# Patient Record
Sex: Male | Born: 1985 | Race: Black or African American | Hispanic: No | Marital: Single | Smoking: Former smoker
Health system: Southern US, Community
[De-identification: ages and names within clinical notes are randomized; demographics above are authoritative.]

## PROBLEM LIST (undated history)

## (undated) HISTORY — PX: MEDIAL COLLATERAL LIGAMENT AND LATERAL COLLATERAL LIGAMENT REPAIR, KNEE: SHX2017

## (undated) HISTORY — PX: ARTHROSCOPIC REPAIR ACL: SUR80

---

## 2004-03-10 ENCOUNTER — Emergency Department (HOSPITAL_COMMUNITY): Admission: EM | Admit: 2004-03-10 | Discharge: 2004-03-10 | Payer: Self-pay | Admitting: *Deleted

## 2006-12-10 ENCOUNTER — Emergency Department (HOSPITAL_COMMUNITY): Admission: EM | Admit: 2006-12-10 | Discharge: 2006-12-10 | Payer: Self-pay | Admitting: Emergency Medicine

## 2007-07-04 ENCOUNTER — Emergency Department (HOSPITAL_COMMUNITY): Admission: EM | Admit: 2007-07-04 | Discharge: 2007-07-04 | Payer: Self-pay | Admitting: Emergency Medicine

## 2007-07-30 ENCOUNTER — Ambulatory Visit (HOSPITAL_BASED_OUTPATIENT_CLINIC_OR_DEPARTMENT_OTHER): Admission: RE | Admit: 2007-07-30 | Discharge: 2007-07-31 | Payer: Self-pay | Admitting: Orthopaedic Surgery

## 2009-02-17 ENCOUNTER — Emergency Department (HOSPITAL_COMMUNITY): Admission: EM | Admit: 2009-02-17 | Discharge: 2009-02-17 | Payer: Self-pay | Admitting: Emergency Medicine

## 2009-09-25 ENCOUNTER — Emergency Department (HOSPITAL_COMMUNITY): Admission: EM | Admit: 2009-09-25 | Discharge: 2009-09-25 | Payer: Self-pay | Admitting: Emergency Medicine

## 2009-09-28 ENCOUNTER — Emergency Department (HOSPITAL_COMMUNITY): Admission: EM | Admit: 2009-09-28 | Discharge: 2009-09-28 | Payer: Self-pay | Admitting: Emergency Medicine

## 2010-07-26 NOTE — Op Note (Signed)
[name], [name]            ACCOUNT NO.:  [id number]   MEDICAL RECORD NO.:  [id number]          PATIENT TYPE:  AMB   LOCATION:  DSC                          FACILITY:  [facility]   PHYSICIAN:  Lubertha Basque. Dalldorf, M.D.DATE OF BIRTH:  05-Oct-1985   DATE OF PROCEDURE:  07/30/2007  DATE OF DISCHARGE:                               OPERATIVE REPORT   PREOPERATIVE DIAGNOSES:  1. Right knee anterior cruciate ligament tear.  2. Right knee torn medial meniscus.   POSTOPERATIVE DIAGNOSES:  1. Right knee anterior cruciate ligament tear.  2. Right knee torn lateral meniscus.   PROCEDURES:  1. Right knee anterior cruciate ligament reconstruction.  2. Right knee partial lateral meniscectomy.   ANESTHESIA:  General.   ATTENDING SURGEON:  Lubertha Basque. Jerl Santos, MD   ASSISTANT:  Lindwood Qua, PA   INDICATIONS FOR PROCEDURE:  The patient is a 25 year old male who is  about a year from a significant knee injury.  He underwent evaluation  done with an MRI scan, which showed an ACL tear.  He has continued to  play basketball, but has suffered recurrent giving way episodes and  effusions.  Seen recently, he is offered ACL reconstruction in hopes of  stabilizing his knee and allowing athletic endeavors.  Informed  operative consent was obtained after discussion of possible  complications of reaction to anesthesia, infection, and DVT.  The  importance of the postoperative rehabilitation program to optimize  result was also stressed with the patient.   SUMMARY, FINDINGS, AND PROCEDURE:  Under general anesthesia, an  arthroscopy of the right knee was performed.  Suprapatellar pouch was  benign while the patellofemoral joint exhibited some focal degeneration  of the apex of patella addressed with a brief chondroplasty for grade 3  change.  The medial compartment exhibited no evidence of meniscal or  articular cartilage injury.  The ACL was completely torn while the PCL  underneath was intact.  The  lateral compartment exhibited displaced full  tear of the posterior horn of the lateral meniscus, which appeared to be  eroding an area near the notch in the lateral compartment with some  grade 3 change, both femoral and tibial.  Chondroplasties were done and  loose body removal was done to remove the articular cartilage resulting  from this abrasion.  We also performed the partial lateral meniscectomy  taking about 15% of this structure back to a stable rim.  I then  reconstructed the ACL with middle third patellar tendon autograft,  stabilized at both ends with 8 x 25 metal Linvatec screws.  Lindwood Qua assisted throughout and was invaluable to the completion of the  case in that he helped position and retract while I performed the  procedure.  He also fashioned the autograft on the back table while I  performed arthroscopic portions of the case, thereby significantly  minimizing OR time.   DESCRIPTION OF PROCEDURE:  The patient was brought to the operating  suite where general anesthetic was applied without difficulty.  He was  positioned supine and prepped and draped in normal sterile fashion.  After administration of IV Kefzol, an  arthroscopy of the right knee was  performed through a total of 2 portals.  Findings were as noted above.  Procedure consisted of loose body removal and chondroplasty in the  lateral compartment followed by partial lateral meniscectomy done with  basket and shaver, taking about 15% of the structure back to a stable  rim.  The ACL stump was removed followed by a conservative notchplasty  done with the burr.  The PCL underneath was intact and appeared normal.  The leg was then elevated, exsanguinated, and tourniquet was placed  about the thigh.  An anterior incision was made along the medial border  of the patellar tendon with dissection down through the peritenon to  expose the tendon.  The middle third of the structure was then harvested  with  oscillating saw and Metzenbaum scissors, harvesting contiguous bone  blocks from the tibial tubercle and inferior pole of the patella.  This  graft was then fashioned on the back table by Lindwood Qua, assisted  through 9 and 10 mm tunnels.  Drill holes were placed in the bone plugs  with a PDS suture placed in one and a wire placed in the other.  Then, a  guide was placed in the knee just anterior to the PCL and utilized to  pass a guidewire through the anterior wound up into the knee.  This was  then overreamed to a diameter of 11 mm.  The guide was placed through  this tunnel and the over-the-top position and utilized to pass the  guidewire through the tibial tunnel and through the femur and out the  proximal thigh.  Over this, we reamed the distal femur to a depth of 3  cm in diameter of 9 mm.  A 1 or 2 mm posterior wall was well-visualized.  Bony debris was removed from the shaver.  The aforementioned graft was  then pulled through the tibial tunnel into the femoral tunnel.  Care was  taken to keep the tendinous surface of the graft facing in a posterior  direction as the leading bone plug entered the femoral tunnel.  I placed  a guidewire in an anterior position of the femoral tunnel and over this  placed a 8 x 25 metal Linvatec screw in interference fashion.  We then  ranged the knee and the graft was felt to be isometric.  A second  guidewire was placed up through the tibial tunnel and seem to enter the  knee arthroscopically.  Over this, I placed an identical interference  screw and secured the trailing bone plug in the tibia.  Arthroscopic  equipment was removed.  Some bone from graft preparation was placed at  the patellar defect followed by closure of the peritenon with 0 Vicryl  in running fashion.  The tourniquet was deflated and a small amount of  bleeding was easily controlled with Bovie cautery.  We then  reapproximated the subcutaneous tissues with 3-0 undyed Vicryl  and skin  with nylon.  Marcaine was injected about the incision site and into the  knee.  Adaptic was applied followed by dry gauze and loose Ace wrap.  Estimated blood loss and fluids as well as accurate tourniquet time  could be obtained from anesthesia records.  At the end of the case, he  had a nice tight Lachman's test.   DISPOSITION:  The patient was extubated in the operating room and taken  to the recovery room in stable condition.  He was to be admitted for  overnight observation for pain control and probable discharged home in  the morning.      Lubertha Basque Jerl Santos, M.D.  Electronically Signed     PGD/MEDQ  D:  07/30/2007  T:  07/30/2007  Job:  [id number]

## 2010-12-07 LAB — POCT HEMOGLOBIN-HEMACUE: Hemoglobin: 16

## 2011-01-29 ENCOUNTER — Emergency Department (HOSPITAL_COMMUNITY)
Admission: EM | Admit: 2011-01-29 | Discharge: 2011-01-29 | Disposition: A | Discharge status: Home / Self Care | Payer: Self-pay | Attending: Emergency Medicine | Admitting: Emergency Medicine

## 2011-01-29 ENCOUNTER — Emergency Department (HOSPITAL_COMMUNITY): Payer: Self-pay

## 2011-01-29 ENCOUNTER — Encounter: Payer: Self-pay | Admitting: *Deleted

## 2011-01-29 DIAGNOSIS — R51 Headache: Secondary | ICD-10-CM | POA: Insufficient documentation

## 2011-01-29 DIAGNOSIS — R221 Localized swelling, mass and lump, neck: Secondary | ICD-10-CM | POA: Insufficient documentation

## 2011-01-29 DIAGNOSIS — S022XXA Fracture of nasal bones, initial encounter for closed fracture: Secondary | ICD-10-CM | POA: Insufficient documentation

## 2011-01-29 DIAGNOSIS — M25519 Pain in unspecified shoulder: Secondary | ICD-10-CM | POA: Insufficient documentation

## 2011-01-29 DIAGNOSIS — S0993XA Unspecified injury of face, initial encounter: Secondary | ICD-10-CM | POA: Insufficient documentation

## 2011-01-29 DIAGNOSIS — IMO0002 Reserved for concepts with insufficient information to code with codable children: Secondary | ICD-10-CM | POA: Insufficient documentation

## 2011-01-29 DIAGNOSIS — IMO0001 Reserved for inherently not codable concepts without codable children: Secondary | ICD-10-CM | POA: Insufficient documentation

## 2011-01-29 DIAGNOSIS — R040 Epistaxis: Secondary | ICD-10-CM | POA: Insufficient documentation

## 2011-01-29 DIAGNOSIS — R220 Localized swelling, mass and lump, head: Secondary | ICD-10-CM | POA: Insufficient documentation

## 2011-01-29 MED ORDER — IBUPROFEN 800 MG PO TABS: 800.0000 mg | ORAL_TABLET | Freq: Three times a day (TID) | ORAL | Status: AC

## 2011-01-29 MED ORDER — OXYMETAZOLINE HCL 0.05 % NA SOLN: 2.0000 | Freq: Two times a day (BID) | NASAL | Status: AC

## 2011-01-29 NOTE — ED Notes (Signed)
Pt c/o pain and abrasion above upper lip and lac rt side lower lip.
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
## 2011-01-30 ENCOUNTER — Encounter (HOSPITAL_COMMUNITY): Payer: Self-pay | Admitting: *Deleted

## 2011-01-30 ENCOUNTER — Emergency Department (HOSPITAL_COMMUNITY): Payer: Self-pay

## 2011-01-30 ENCOUNTER — Emergency Department (HOSPITAL_COMMUNITY)
Admission: EM | Admit: 2011-01-30 | Discharge: 2011-01-30 | Discharge status: Absent without leave | Payer: Self-pay | Source: Home / Self Care

## 2011-01-30 ENCOUNTER — Emergency Department (HOSPITAL_COMMUNITY)
Admission: EM | Admit: 2011-01-30 | Discharge: 2011-01-30 | Disposition: A | Discharge status: Home / Self Care | Payer: Self-pay | Attending: Emergency Medicine | Admitting: Emergency Medicine

## 2011-01-30 DIAGNOSIS — M79609 Pain in unspecified limb: Secondary | ICD-10-CM | POA: Insufficient documentation

## 2011-01-30 DIAGNOSIS — S60229A Contusion of unspecified hand, initial encounter: Secondary | ICD-10-CM | POA: Insufficient documentation

## 2011-01-30 DIAGNOSIS — M25539 Pain in unspecified wrist: Secondary | ICD-10-CM | POA: Insufficient documentation

## 2011-01-30 DIAGNOSIS — S60219A Contusion of unspecified wrist, initial encounter: Secondary | ICD-10-CM | POA: Insufficient documentation

## 2011-01-30 MED ORDER — IBUPROFEN 200 MG PO TABS: 400.0000 mg | ORAL_TABLET | Freq: Once | ORAL | Status: DC

## 2011-01-30 MED ORDER — OXYCODONE-ACETAMINOPHEN 5-325 MG PO TABS
1.0000 | ORAL_TABLET | Freq: Once | ORAL | Status: AC
  Administered 2011-01-30: 1 via ORAL
  Filled 2011-01-30: qty 1

## 2011-01-30 MED ORDER — IBUPROFEN 800 MG PO TABS
800.0000 mg | ORAL_TABLET | Freq: Once | ORAL | Status: AC
  Administered 2011-01-30: 800 mg via ORAL
  Filled 2011-01-30: qty 1

## 2011-01-30 MED ORDER — OXYCODONE-ACETAMINOPHEN 5-325 MG PO TABS: 1.0000 | ORAL_TABLET | Freq: Once | ORAL | Status: DC
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
## 2011-01-30 NOTE — ED Notes (Signed)
Patient transported to X-ray 
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
## 2011-02-23 NOTE — ED Provider Notes (Signed)
Evaluation and management procedures were performed by the mid-level provider (PA/NP/CNM) under my supervision/collaboration. I was present and available during the ED course. Gauge Winski Y.   Gavin Pound. Oletta Lamas, MD 02/23/11 2157

## 2013-07-16 ENCOUNTER — Emergency Department (HOSPITAL_COMMUNITY)
Admission: EM | Admit: 2013-07-16 | Discharge: 2013-07-16 | Disposition: A | Discharge status: Home / Self Care | Payer: No Typology Code available for payment source | Attending: Emergency Medicine | Admitting: Emergency Medicine

## 2013-07-16 ENCOUNTER — Emergency Department (HOSPITAL_COMMUNITY): Payer: No Typology Code available for payment source

## 2013-07-16 ENCOUNTER — Encounter (HOSPITAL_COMMUNITY): Payer: Self-pay | Admitting: Emergency Medicine

## 2013-07-16 DIAGNOSIS — F17200 Nicotine dependence, unspecified, uncomplicated: Secondary | ICD-10-CM | POA: Insufficient documentation

## 2013-07-16 DIAGNOSIS — S99929A Unspecified injury of unspecified foot, initial encounter: Secondary | ICD-10-CM

## 2013-07-16 DIAGNOSIS — Y9389 Activity, other specified: Secondary | ICD-10-CM | POA: Insufficient documentation

## 2013-07-16 DIAGNOSIS — M7918 Myalgia, other site: Secondary | ICD-10-CM

## 2013-07-16 DIAGNOSIS — S46909A Unspecified injury of unspecified muscle, fascia and tendon at shoulder and upper arm level, unspecified arm, initial encounter: Secondary | ICD-10-CM | POA: Insufficient documentation

## 2013-07-16 DIAGNOSIS — Z9889 Other specified postprocedural states: Secondary | ICD-10-CM | POA: Insufficient documentation

## 2013-07-16 DIAGNOSIS — S4980XA Other specified injuries of shoulder and upper arm, unspecified arm, initial encounter: Secondary | ICD-10-CM | POA: Insufficient documentation

## 2013-07-16 DIAGNOSIS — Y92410 Unspecified street and highway as the place of occurrence of the external cause: Secondary | ICD-10-CM | POA: Insufficient documentation

## 2013-07-16 DIAGNOSIS — S0990XA Unspecified injury of head, initial encounter: Secondary | ICD-10-CM | POA: Insufficient documentation

## 2013-07-16 DIAGNOSIS — S8990XA Unspecified injury of unspecified lower leg, initial encounter: Secondary | ICD-10-CM | POA: Insufficient documentation

## 2013-07-16 DIAGNOSIS — S0993XA Unspecified injury of face, initial encounter: Secondary | ICD-10-CM | POA: Insufficient documentation

## 2013-07-16 DIAGNOSIS — S199XXA Unspecified injury of neck, initial encounter: Principal | ICD-10-CM

## 2013-07-16 DIAGNOSIS — S99919A Unspecified injury of unspecified ankle, initial encounter: Secondary | ICD-10-CM

## 2013-07-16 MED ORDER — HYDROCODONE-ACETAMINOPHEN 5-325 MG PO TABS: ORAL_TABLET | ORAL | Status: DC
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
## 2013-07-16 NOTE — Discharge Instructions (Signed)
Take vicodin for breakthrough pain, do not drink alcohol, drive, care for children or do other critical tasks while taking vicodin.  Do not hesitate to return to the Emergency Department for any new, worsening or concerning symptoms.   If you do not have a primary care doctor you can establish one at the   [facility]: [address] [place] [id number] [phone]  After you establish care. Let them know you were seen in the emergency room. They must obtain records for further management.     Myalgia, Adult Myalgia is the medical term for muscle pain. It is a symptom of many things. Nearly everyone at some time in their life has this. The most common cause for muscle pain is overuse or straining and more so when you are not in shape. Injuries and muscle bruises cause myalgias. Muscle pain without a history of injury can also be caused by a virus. It frequently comes along with the flu. Myalgia not caused by muscle strain can be present in a large number of infectious diseases. Some autoimmune diseases like lupus and fibromyalgia can cause muscle pain. Myalgia may be mild, or severe. SYMPTOMS  The symptoms of myalgia are simply muscle pain. Most of the time this is short lived and the pain goes away without treatment. DIAGNOSIS  Myalgia is diagnosed by your caregiver by taking your history. This means you tell him when the problems began, what they are, and what has been happening. If this has not been a long term problem, your caregiver may want to watch for a while to see what will happen. If it has been long term, they may want to do additional testing. TREATMENT  The treatment depends on what the underlying cause of the muscle pain is. Often anti-inflammatory medications will help. HOME CARE INSTRUCTIONS  If the pain in your muscles came from overuse, slow down your activities until the problems go away.  Myalgia from overuse of a muscle can be treated with  alternating hot and cold packs on the muscle affected or with cold for the first couple days. If either heat or cold seems to make things worse, stop their use.  Apply ice to the sore area for 15-20 minutes, 03-04 times per day, while awake for the first 2 days of muscle soreness, or as directed. Put the ice in a plastic bag and place a towel between the bag of ice and your skin.  Only take over-the-counter or prescription medicines for pain, discomfort, or fever as directed by your caregiver.  Regular gentle exercise may help if you are not active.  Stretching before strenuous exercise can help lower the risk of myalgia. It is normal when beginning an exercise regimen to feel some muscle pain after exercising. Muscles that have not been used frequently will be sore at first. If the pain is extreme, this may mean injury to a muscle. SEEK MEDICAL CARE IF:  You have an increase in muscle pain that is not relieved with medication.  You begin to run a temperature.  You develop nausea and vomiting.  You develop a stiff and painful neck.  You develop a rash.  You develop muscle pain after a tick bite.  You have continued muscle pain while working out even after you are in good condition. SEEK IMMEDIATE MEDICAL CARE IF: Any of your problems are getting worse and medications are not helping. MAKE SURE YOU:   Understand these instructions.  Will watch your condition.  Will get help right away if you are not doing well or get worse. Document Released: 01/19/2006 Document Revised: 05/22/2011 Document Reviewed: 04/10/2006 [facility] Patient Information 2014 [place] [place].

## 2013-07-18 NOTE — ED Provider Notes (Signed)
Medical screening examination/treatment/procedure(s) were performed by non-physician practitioner and as supervising physician I was immediately available for consultation/collaboration.   Candyce ChurnJohn David Tenika Keeran III, MD 07/18/13 [phone]

## 2013-09-08 ENCOUNTER — Encounter (HOSPITAL_COMMUNITY): Payer: Self-pay | Admitting: Emergency Medicine

## 2013-09-08 ENCOUNTER — Emergency Department (HOSPITAL_COMMUNITY)
Admission: EM | Admit: 2013-09-08 | Discharge: 2013-09-08 | Disposition: A | Discharge status: Home / Self Care | Payer: No Typology Code available for payment source | Attending: Emergency Medicine | Admitting: Emergency Medicine

## 2013-09-08 DIAGNOSIS — F17200 Nicotine dependence, unspecified, uncomplicated: Secondary | ICD-10-CM | POA: Insufficient documentation

## 2013-09-08 DIAGNOSIS — M7732 Calcaneal spur, left foot: Secondary | ICD-10-CM

## 2013-09-08 DIAGNOSIS — M722 Plantar fascial fibromatosis: Secondary | ICD-10-CM | POA: Insufficient documentation

## 2013-09-08 DIAGNOSIS — M7730 Calcaneal spur, unspecified foot: Secondary | ICD-10-CM | POA: Insufficient documentation

## 2013-09-08 DIAGNOSIS — Z791 Long term (current) use of non-steroidal anti-inflammatories (NSAID): Secondary | ICD-10-CM | POA: Insufficient documentation

## 2013-09-08 DIAGNOSIS — H109 Unspecified conjunctivitis: Secondary | ICD-10-CM | POA: Insufficient documentation

## 2013-09-08 DIAGNOSIS — G8911 Acute pain due to trauma: Secondary | ICD-10-CM | POA: Insufficient documentation

## 2013-09-08 MED ORDER — IBUPROFEN 400 MG PO TABS
800.0000 mg | ORAL_TABLET | Freq: Once | ORAL | Status: AC
  Administered 2013-09-08: 800 mg via ORAL
  Filled 2013-09-08: qty 2

## 2013-09-08 MED ORDER — TETRACAINE HCL 0.5 % OP SOLN
2.0000 [drp] | Freq: Once | OPHTHALMIC | Status: AC
  Administered 2013-09-08: 2 [drp] via OPHTHALMIC
  Filled 2013-09-08: qty 2

## 2013-09-08 MED ORDER — FLUORESCEIN SODIUM 1 MG OP STRP
1.0000 | ORAL_STRIP | Freq: Once | OPHTHALMIC | Status: AC
  Administered 2013-09-08: 1 via OPHTHALMIC
  Filled 2013-09-08: qty 1

## 2013-09-08 MED ORDER — IBUPROFEN 800 MG PO TABS: 800.0000 mg | ORAL_TABLET | Freq: Three times a day (TID) | ORAL | Status: DC

## 2013-09-08 MED ORDER — ERYTHROMYCIN 5 MG/GM OP OINT: TOPICAL_OINTMENT | OPHTHALMIC | Status: DC
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
## 2013-09-08 NOTE — Discharge Instructions (Signed)
1. Medications: ibuprofen, erythromycin, usual home medications 2. Treatment: rest, drink plenty of fluids,  3. Follow Up: Please followup with your primary doctor for discussion of your diagnoses and further evaluation after today's visit; if you do not have a primary care doctor use the resource guide provided to find one; please follow-up with opthalmology in 3 days     Conjunctivitis Conjunctivitis is commonly called "pink eye." Conjunctivitis can be caused by bacterial or viral infection, allergies, or injuries. There is usually redness of the lining of the eye, itching, discomfort, and sometimes discharge. There may be deposits of matter along the eyelids. A viral infection usually causes a watery discharge, while a bacterial infection causes a yellowish, thick discharge. Pink eye is very contagious and spreads by direct contact. You may be given antibiotic eyedrops as part of your treatment. Before using your eye medicine, remove all drainage from the eye by washing gently with warm water and cotton balls. Continue to use the medication until you have awakened 2 mornings in a row without discharge from the eye. Do not rub your eye. This increases the irritation and helps spread infection. Use separate towels from other household members. Wash your hands with soap and water before and after touching your eyes. Use cold compresses to reduce pain and sunglasses to relieve irritation from light. Do not wear contact lenses or wear eye makeup until the infection is gone. SEEK MEDICAL CARE IF:   Your symptoms are not better after 3 days of treatment.  You have increased pain or trouble seeing.  The outer eyelids become very red or swollen. Document Released: 04/06/2004 Document Revised: 05/22/2011 Document Reviewed: 02/27/2005 [facility] Patient Information 2015 [place], [place]. This information is not intended to replace advice given to you by your health care provider. Make sure you discuss any  questions you have with your health care provider.    Heel Spur A heel spur is a hook of bone that can form on the calcaneus (the heel bone and the largest bone of the foot). Heel spurs are often associated with plantar fasciitis and usually come in people who have had the problem for an extended period of time. The cause of the relationship is unknown. The pain associated with them is thought to be caused by an inflammation (soreness and redness) of the plantar fascia rather than the spur itself. The plantar fascia is a thick fibrous like tissue that runs from the calcaneus (heel bone) to the ball of the foot. This strong, tight tissue helps maintain the arch of your foot. It helps distribute the weight across your foot as you walk or run. Stresses placed on the plantar fascia can be tremendous. When it is inflamed normal activities become painful. Pain is worse in the morning after sleeping. After sleeping the plantar fascia is tight. The first movements stretch the fascia and this causes pain. As the tendon loosens, the pain usually gets better. It often returns with too much standing or walking.  About 70% of patients with plantar fasciitis have a heel spur. About half of people without foot pain also have heel spurs. DIAGNOSIS  The diagnosis of a heel spur is made by X-ray. The X-ray shows a hook of bone protruding from the bottom of the calcaneus at the point where the plantar fascia is attached to the heel bone.  TREATMENT  It is necessary to find out what is causing the stretching of the plantar fascia. If the cause is over-pronation (flat feet), orthotics  and proper foot ware may help.  Stretching exercises, losing weight, wearing shoes that have a cushioned heel that absorbs shock, and elevating the heel with the use of a heel cradle, heel cup, or orthotics may all help. Heel cradles and heel cups provide extra comfort and cushion to the heel, and reduce the amount of shock to the sore  area. AVOIDING THE PAIN OF PLANTAR FASCIITIS AND HEEL SPURS  Consult a sports medicine professional before beginning a new exercise program.  Walking programs offer a good workout. There is a lower chance of overuse injuries common to the runners. There is less impact and less jarring of the joints.  Begin all new exercise programs slowly. If problems or pains develop, decrease the amount of time or distance until you are at a comfortable level.  Wear good shoes and replace them regularly.  Stretch your foot and the heel cords at the back of the ankle (Achilles tendons) both before and after exercise.  Run or exercise on even surfaces that are not hard. For example, asphalt is better than pavement.  Do not run barefoot on hard surfaces.  If using a treadmill, vary the incline.  Do not continue to workout if you have foot or joint problems. Seek professional help if they do not improve. HOME CARE INSTRUCTIONS   Avoid activities that cause you pain until you recover.  Use ice or cold packs to the problem or painful areas after working out.  Only take over-the-counter or prescription medicines for pain, discomfort, or fever as directed by your caregiver.  Soft shoe inserts or athletic shoes with air or gel sole cushions may be helpful.  If problems continue or become more severe, consult a sports medicine caregiver. Cortisone is a potent anti-inflammatory medication that may be injected into the painful area. You can discuss this treatment with your caregiver. MAKE SURE YOU:   Understand these instructions.  Will watch your condition.  Will get help right away if you are not doing well or get worse. Document Released: 04/05/2005 Document Revised: 05/22/2011 Document Reviewed: 06/07/2005 [facility] Patient Information 2015 [place]ExitCare, [place]LLC. This information is not intended to replace advice given to you by your health care provider. Make sure you discuss any questions you have with  your health care provider.

## 2013-09-08 NOTE — ED Provider Notes (Signed)
Medical screening examination/treatment/procedure(s) were performed by non-physician practitioner and as supervising physician I was immediately available for consultation/collaboration.   EKG Interpretation None        Layla MawKristen N Ward, DO 09/08/13 2320
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
## 2013-09-09 ENCOUNTER — Telehealth (HOSPITAL_BASED_OUTPATIENT_CLINIC_OR_DEPARTMENT_OTHER): Payer: Self-pay | Admitting: Emergency Medicine

## 2014-03-11 ENCOUNTER — Emergency Department (HOSPITAL_COMMUNITY)
Admission: EM | Admit: 2014-03-11 | Discharge: 2014-03-11 | Disposition: A | Discharge status: Home / Self Care | Payer: No Typology Code available for payment source | Attending: Emergency Medicine | Admitting: Emergency Medicine

## 2014-03-11 ENCOUNTER — Emergency Department (HOSPITAL_COMMUNITY): Payer: No Typology Code available for payment source

## 2014-03-11 ENCOUNTER — Encounter (HOSPITAL_COMMUNITY): Payer: Self-pay | Admitting: Emergency Medicine

## 2014-03-11 DIAGNOSIS — S199XXA Unspecified injury of neck, initial encounter: Secondary | ICD-10-CM | POA: Diagnosis present

## 2014-03-11 DIAGNOSIS — Y92410 Unspecified street and highway as the place of occurrence of the external cause: Secondary | ICD-10-CM | POA: Insufficient documentation

## 2014-03-11 DIAGNOSIS — Y9389 Activity, other specified: Secondary | ICD-10-CM | POA: Insufficient documentation

## 2014-03-11 DIAGNOSIS — S0990XA Unspecified injury of head, initial encounter: Secondary | ICD-10-CM | POA: Insufficient documentation

## 2014-03-11 DIAGNOSIS — M542 Cervicalgia: Secondary | ICD-10-CM

## 2014-03-11 DIAGNOSIS — Y998 Other external cause status: Secondary | ICD-10-CM | POA: Diagnosis not present

## 2014-03-11 DIAGNOSIS — S025XXA Fracture of tooth (traumatic), initial encounter for closed fracture: Secondary | ICD-10-CM | POA: Insufficient documentation

## 2014-03-11 DIAGNOSIS — Z720 Tobacco use: Secondary | ICD-10-CM | POA: Diagnosis not present

## 2014-03-11 MED ORDER — METHOCARBAMOL 500 MG PO TABS: 500.0000 mg | ORAL_TABLET | Freq: Two times a day (BID) | ORAL | Status: DC

## 2014-03-11 MED ORDER — OXYCODONE-ACETAMINOPHEN 5-325 MG PO TABS
2.0000 | ORAL_TABLET | Freq: Once | ORAL | Status: AC
  Administered 2014-03-11: 2 via ORAL
  Filled 2014-03-11: qty 2

## 2014-03-11 MED ORDER — HYDROCODONE-ACETAMINOPHEN 5-325 MG PO TABS: 1.0000 | ORAL_TABLET | ORAL | Status: DC | PRN

## 2014-03-11 MED ORDER — DIAZEPAM 5 MG PO TABS
5.0000 mg | ORAL_TABLET | Freq: Once | ORAL | Status: AC
  Administered 2014-03-11: 5 mg via ORAL
  Filled 2014-03-11: qty 1

## 2014-03-11 MED ORDER — IBUPROFEN 600 MG PO TABS: 600.0000 mg | ORAL_TABLET | Freq: Four times a day (QID) | ORAL | Status: DC | PRN
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
## 2014-03-11 NOTE — Discharge Instructions (Signed)
Patients with Medicaid: [facility] °[address], [phone] °[address], [phone] ° °If unable to pay, or uninsured, contact [facility] ([phone]) or [place] ([phone] in [place], [phone] in [place]) to become qualified for the adult dental clinic ° °Other Low-Cost Community Dental Services: °[facility] [address], [place], [place], [address] °   [phone], Ext. 123 °   2nd and 4th Thursday of the month at 6:30am °   10 clients each day by appointment, can sometimes see walk-in patients if someone does not show for an appointment °[facility] [address], [place], [place], [address] °   [phone] °[facility]- [address], [place], [place], [address] °   [phone] ° °[facility]- [phone] °[facility]- [phone] °[facility]- [phone] ° ° °Emergency Department Resource Guide °1) Find a Doctor and Pay Out of Pocket °Although you won't have to find out who is covered by your insurance plan, it is a good idea to ask around and get recommendations. You will then need to call the office and see if the doctor you have chosen will accept you as a new patient and what types of options they offer for patients who are self-pay. Some doctors offer discounts or will set up payment plans for their patients who do not have insurance, but you will need to ask so you aren't surprised when you get to your appointment. ° °2) Contact Your Local Health Department °Not all health departments have doctors that can see patients for sick visits, but many do, so it is worth a call to see if yours does. If you don't know where your local health department is, you can check in your phone book. The CDC also has a tool to help you locate your state's health department, and many state websites also have listings of all of their local health departments. ° °3)  Find a Walk-in Clinic °If your illness is not likely to be very severe or complicated, you may want to try a walk in clinic. These are popping up all over the country in pharmacies, drugstores, and shopping centers. They're usually staffed by nurse practitioners or physician assistants that have been trained to treat common illnesses and complaints. They're usually fairly quick and inexpensive. However, if you have serious medical issues or chronic medical problems, these are probably not your best option. ° °No Primary Care Doctor: °- Call [facility] at  [phone] - they can help you locate a primary care doctor that  accepts your insurance, provides certain services, etc. °- Physician Referral Service- 1-800-533-3463 ° °Chronic Pain Problems: °Organization         Address  Phone   Notes  °[facility]  ([phone]) [phone] Patients need to be referred by their primary care doctor.  ° °Medication Assistance: °Organization         Address  Phone   Notes  °[place] [facility] [address] °[place], [place] [address] ([phone]) 641-8030 --Must be a resident of [place] °-- Must have NO insurance coverage whatsoever (no Medicaid/ Medicare, etc.) °-- The pt. MUST have a primary care doctor that directs their care regularly and follows them in the community °  °[facility]  (866) 331-1348   °[employer]  (888) 892-1162   ° °Agencies that provide inexpensive medical care: °Organization           Address  Phone   Notes  °[facility]  ([phone]) [phone]   °[facility]    (336) 832-7272   °[facility] [address] °[place], [place] [address] (336) 832-4777   °[facility] [address], °[place] (336) [phone]   °[facility]    (336) 373-0678   °[facility]    (336) 272-1050   °[facility] ° [address], [place] Phone:  (336) 832-4444, Fax:  (336)  832-4440 Hours of Operation:  9 am - 6 pm, M-F.  Also accepts Medicaid/Medicare and self-pay.  °[facility] ° [address], [place] Phone: (336) [phone], Fax: (336) [phone]. Hours of Operation:  8:30 am - 5:30 pm, M-F.  Also accepts Medicaid and self-pay.  °[facility] [place] [address], [place] Phone: (336) 878-6027   °[facility] [address], [place], [place] (336)723-1848, Ext. 123 Mondays & Thursdays: 7-9 AM.  First 15 patients are seen on a first come, first serve basis. °  ° °Medicaid-accepting [place] Providers: ° °Organization         Address  Phone   Notes  °[facility] [address], [place] ([phone]) [phone] Also accepts self-pay patients.  °[facility] [address], [place] ° (336) [phone]   °[facility] [address], [place] (336) [phone]   °[facility] [address] [place] [address], [place] (336) 299-7000   °[facility] [address], [place]  ° (336) [phone] Only accepts [place] Access Medicaid patients after they have their name applied to their card.  ° °Self-Pay (no insurance) in [place]: ° °Organization         Address  Phone   Notes  °Sickle Cell Patients, [facility] [address], [place] (336) [phone]   °[facility] [address], [place] (336) 832-4400   ° [facility] ° [address], [place] (336) 992-4800   °[facility]/Dr. Osei-Bonsu ° [address] [place] [address], [place] or [address], [place] (336) [phone] Phone number for both [place] and [place] locations is the same.  °[facility] [address], [place] (336) [phone]   °[facility] [address] [place] [address], [place] or [address] (336) 852-7530 °(336) [phone]     °[facility] [address], [place] (336) 350-1642, phone; (336) 294-5005, fax Sees patients 1st and 3rd Saturday of every month.  Must not qualify for public or private insurance (i.e. Medicaid, Medicare, [place] Health Choice, Veterans' Benefits) • Household income should be no more than 200% of the poverty level •The clinic cannot treat you if you are pregnant or think you are pregnant • Sexually transmitted diseases are not treated at the clinic.  ° °                                Dental Care:                           °Organization         Address  Phone  Notes  °[place]   [facility] [address], [place] (336) [phone] Accepts children up to age 21 who are enrolled in Medicaid or [place] Health Choice; pregnant women with a Medicaid card; and children who have applied for Medicaid or [place] Health Choice, but were declined, whose parents can pay a reduced fee at time of service.  °[facility] [place]  [address], [place] (336) 641-7733 Accepts children up to age 21 who are enrolled in Medicaid or [place] Health Choice; pregnant women with a Medicaid card; and children who have applied for Medicaid or [place] Health Choice, but were declined, whose parents can pay a reduced fee at time of service.  °[facility] ° [address], [place] (336) [phone] Patients are seen by appointment only. Walk-ins are not accepted. [facility] will see patients 18 years of age and older. °Monday - Tuesday (8am-5pm) °Most Wednesdays (8:30-5pm) °$30 per visit, cash only  °[facility] ° [address], [place] ([phone]) [phone] Patients are seen by appointment only. Walk-ins are not accepted. [facility] will see patients 18 years of age and older. °One Wednesday Evening (Monthly: Volunteer Based).  $30 per visit, cash only  °[facility]   ([phone]) [phone] for adults; Children under age 4, call Graduate Pediatric Dentistry at ([phone]) [phone]. Children aged 4-14, please call ([phone]) [phone] to request a pediatric application. ° Dental services are provided in all areas of dental care including fillings, crowns and bridges, complete and partial dentures, implants, gum treatment, root canals, and extractions. Preventive care is also provided. Treatment is provided to both adults and children. °Patients are selected via a lottery and there is often a waiting list. °  °[facility] [address], °[place] ° (336) 763-8833 [email or web address] °  °[facility] [address], [place], [place] (336)723-1848, Ext. 123 Second and Fourth Thursday of each month, opens at 6:30 AM; Clinic ends at 9 AM.  Patients are seen on a first-come first-served basis, and a limited number are seen during each clinic.  ° °[facility] ° [address], [place], [place] (336) [phone]   Eligibility Requirements °You must have lived in [place], [place], or [place] counties for at least the last three months. °  You cannot be eligible for state or federal sponsored healthcare insurance, including Veterans Administration, Medicaid, or Medicare. °  You generally cannot be eligible for healthcare insurance through your employer.  °  How to apply: °Eligibility screenings are held every Tuesday and Wednesday afternoon from 1:00 pm until 4:00 pm. You do not need an appointment for the interview!  °[facility] [address], [place], [place] [phone]   °[facility]  [phone]   °[facility]  [phone]   °[facility]  336-570-6415   ° °  provided in all areas of dental care including fillings, crowns and bridges, complete and partial dentures, implants, gum treatment, root canals, and extractions. Preventive care is also provided. Treatment is provided to both adults and children. Patients are selected via a lottery and there is often a waiting list.   [facility] [address] [place]  [phone] [email or web address]   [facility] [address] [place], [place] [phone], Ext. 123 Second and Fourth Thursday of each month, opens at 6:30 AM; Clinic ends at 9 AM.  Patients are seen on a first-come first-served basis, and a limited number are seen during each clinic.   [facility]  [address] [place], [place] [phone]   Eligibility Requirements You must have lived in [place], [place], or [place] counties for at least the last three months.   You cannot be eligible for state or federal sponsored [place], including [employer], [place], or [employer].   You generally cannot be eligible for healthcare insurance through your employer.    How to apply: Eligibility screenings are held every Tuesday and Wednesday afternoon from 1:00 pm until 4:00 pm. You do not need an appointment for the interview!  [facility] [address], [place], [place] [phone]   [facility] Health  Department  [phone]   [facility] Health Department  [phone]   [facility] Health Department  [phone]
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
## 2014-03-18 ENCOUNTER — Encounter (HOSPITAL_COMMUNITY): Payer: Self-pay | Admitting: *Deleted

## 2014-03-18 ENCOUNTER — Emergency Department (HOSPITAL_COMMUNITY)
Admission: EM | Admit: 2014-03-18 | Discharge: 2014-03-18 | Disposition: A | Discharge status: Home / Self Care | Payer: No Typology Code available for payment source | Attending: Emergency Medicine | Admitting: Emergency Medicine

## 2014-03-18 DIAGNOSIS — K088 Other specified disorders of teeth and supporting structures: Secondary | ICD-10-CM | POA: Insufficient documentation

## 2014-03-18 DIAGNOSIS — G8911 Acute pain due to trauma: Secondary | ICD-10-CM | POA: Insufficient documentation

## 2014-03-18 DIAGNOSIS — S025XXD Fracture of tooth (traumatic), subsequent encounter for fracture with routine healing: Secondary | ICD-10-CM | POA: Diagnosis not present

## 2014-03-18 DIAGNOSIS — Z720 Tobacco use: Secondary | ICD-10-CM | POA: Insufficient documentation

## 2014-03-18 DIAGNOSIS — K0889 Other specified disorders of teeth and supporting structures: Secondary | ICD-10-CM

## 2014-03-18 DIAGNOSIS — R51 Headache: Secondary | ICD-10-CM | POA: Diagnosis present

## 2014-03-18 DIAGNOSIS — K0381 Cracked tooth: Secondary | ICD-10-CM

## 2014-03-18 DIAGNOSIS — Z79899 Other long term (current) drug therapy: Secondary | ICD-10-CM | POA: Diagnosis not present

## 2014-03-18 MED ORDER — OXYCODONE-ACETAMINOPHEN 5-325 MG PO TABS: 1.0000 | ORAL_TABLET | Freq: Four times a day (QID) | ORAL | Status: DC | PRN

## 2014-03-18 MED ORDER — PENICILLIN V POTASSIUM 500 MG PO TABS: 500.0000 mg | ORAL_TABLET | Freq: Four times a day (QID) | ORAL | Status: AC
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
## 2014-03-18 NOTE — Discharge Instructions (Signed)
Dental Fracture You have a dental fracture or injury. This can mean the tooth is loose, has a chip in the enamel or is broken. If just the outer enamel is chipped, there is a good chance the tooth will not become infected. The only treatment needed may be to smooth off a rough edge. Fractures into the deeper layers (dentin and pulp) cause greater pain and are more likely to become infected. These require you to see a dentist as soon as possible to save the tooth. Loose teeth may need to be wired or bonded with a plastic splint to hold them in place. A paste may be painted on the open area of the broken tooth to reduce the pain. Antibiotics and pain medicine may be prescribed. Choosing a soft or liquid diet and rinsing the mouth out with warm water after meals may be helpful. See your dentist as recommended. Failure to seek care or follow up with a dentist or other specialist as recommended could result in the loss of your tooth, infection, or permanent dental problems. SEEK MEDICAL CARE IF:   You have increased pain not controlled with medicines.  You have swelling around the tooth, in the face or neck.  You have bleeding which starts, continues, or gets worse.  You have a fever. Document Released: 04/06/2004 Document Revised: 05/22/2011 Document Reviewed: 01/19/2009 [facility] Patient Information 2015 [employer] [place], [place]. This information is not intended to replace advice given to you by your health care provider. Make sure you discuss any questions you have with your health care provider.  Dental Injury Your exam shows that you have injured your teeth. The treatment of broken teeth and other dental injuries depends on how badly they are hurt. All dental injuries should be checked as soon as possible by a dentist if there are:  Loose teeth which may need to be wired or bonded with a plastic device to hold them in place.  Broken teeth with exposed tooth pulp which may cause a serious  infection.  Painful teeth especially when you bite or chew.  Sharp tooth edges that cut your tongue or lips. Sometimes, antibiotics or pain medicine are prescribed to prevent infection and control pain. Eat a soft or liquid diet and rinse your mouth out after meals with warm water. You should see a dentist or return here at once if you have increased swelling, increased pain or uncontrolled bleeding from the site of your injury. SEEK MEDICAL CARE IF:   You have increased pain not controlled with medicines.  You have swelling around your tooth, in your face or neck.  You have bleeding which starts, continues, or gets worse.  You have a fever. Document Released: 02/27/2005 Document Revised: 05/22/2011 Document Reviewed: 02/26/2009 [facility] Patient Information 2015 [place], [place]. This information is not intended to replace advice given to you by your health care provider. Make sure you discuss any questions you have with your health care provider.

## 2014-03-18 NOTE — ED Notes (Signed)
Pt states he has an appointment with a dentist 03/25/14.
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
## 2014-07-10 ENCOUNTER — Encounter (HOSPITAL_COMMUNITY): Payer: Self-pay | Admitting: *Deleted

## 2014-07-10 ENCOUNTER — Emergency Department (HOSPITAL_COMMUNITY)
Admission: EM | Admit: 2014-07-10 | Discharge: 2014-07-10 | Disposition: A | Discharge status: Home / Self Care | Payer: Self-pay | Attending: Emergency Medicine | Admitting: Emergency Medicine

## 2014-07-10 DIAGNOSIS — R220 Localized swelling, mass and lump, head: Secondary | ICD-10-CM

## 2014-07-10 DIAGNOSIS — Z79899 Other long term (current) drug therapy: Secondary | ICD-10-CM | POA: Insufficient documentation

## 2014-07-10 DIAGNOSIS — M546 Pain in thoracic spine: Secondary | ICD-10-CM | POA: Insufficient documentation

## 2014-07-10 DIAGNOSIS — M542 Cervicalgia: Secondary | ICD-10-CM | POA: Insufficient documentation

## 2014-07-10 DIAGNOSIS — M549 Dorsalgia, unspecified: Secondary | ICD-10-CM

## 2014-07-10 DIAGNOSIS — R600 Localized edema: Secondary | ICD-10-CM | POA: Insufficient documentation

## 2014-07-10 DIAGNOSIS — Z720 Tobacco use: Secondary | ICD-10-CM | POA: Insufficient documentation

## 2014-07-10 MED ORDER — IBUPROFEN 800 MG PO TABS
800.0000 mg | ORAL_TABLET | Freq: Three times a day (TID) | ORAL | Status: DC | PRN
Start: 2014-07-10 — End: 2015-10-19

## 2014-07-10 MED ORDER — METHOCARBAMOL 500 MG PO TABS: 500.0000 mg | ORAL_TABLET | Freq: Four times a day (QID) | ORAL | Status: DC | PRN

## 2014-07-10 NOTE — ED Provider Notes (Signed)
CSN: [id number]     Arrival date & time 07/10/14  1257 History  This chart was scribed for non-physician practitioner, Trixie DredgeEmily Tyson Parkison, PA-C, working with Mancel BaleElliott Wentz, MD, by Bronson CurbJacqueline Melvin, ED Scribe. This patient was seen in room TR05C/TR05C and the patient's care was started at 1:48 PM.   Chief Complaint  Patient presents with  . Shoulder Pain  . Oral Swelling    The history is provided by the patient. No language interpreter was used.     HPI Comments: [name] [name] is a 29 y.o. male who presents to the Emergency Department complaining of constant, sharp, left neck and upper back pain that began this morning. Patient states he woke up with the pain, but denies any prior falls, injury, or trauma. He states he was playing basketball yesterday, but denies any falls or injury and notes he plays basketball daily. The pain is exacerbated by movement. Patient has not taken anything for pain relief since onset. He denies fever, chills, sore throat, cold symptoms, chest pain, abdominal pain, nausea, or vomiting. Patient is right hand dominant.  Denies any numbness or weakness of the arms.    Patient is also complaining swelling to the upper lip. He denies any dental pain.  He denies ingesting suspicious food, exposure to any known allergens. He further denies tongue swelling, throat swelling, difficulty swallowing, SOB, or rash.  Denies any change in or new medications.    History reviewed. No pertinent past medical history. Past Surgical History  Procedure Laterality Date  . Arthroscopic repair acl    . Medial collateral ligament and lateral collateral ligament repair, knee     No family history on file. History  Substance Use Topics  . Smoking status: Current Some Day Smoker    Types: Cigarettes  . Smokeless tobacco: Not on file  . Alcohol Use: Yes     Comment: occ    Review of Systems  Constitutional: Negative for fever and chills.  HENT: Positive for facial swelling (upper lip).  Negative for congestion, dental problem, drooling, rhinorrhea, sinus pressure, sore throat and trouble swallowing.   Respiratory: Negative for shortness of breath.   Cardiovascular: Negative for chest pain.  Gastrointestinal: Negative for nausea, vomiting and abdominal pain.  Musculoskeletal: Positive for neck pain.  Skin: Negative for rash.  Allergic/Immunologic: Negative for immunocompromised state.  Neurological: Negative for weakness, light-headedness, numbness and headaches.  Psychiatric/Behavioral: Negative for self-injury.      Allergies  Review of patient's allergies indicates no known allergies.  Home Medications   Prior to Admission medications   Medication Sig Start Date End Date Taking? Authorizing Provider  erythromycin ophthalmic ointment Place a 1/2 inch ribbon of ointment into the lower eyelid. 09/08/13   [name], PA-C  HYDROcodone-acetaminophen (NORCO/VICODIN) 5-325 MG per tablet Take 1-2 tablets by mouth every 4 (four) hours as needed for moderate pain or severe pain. 03/11/14   [name], PA-C  ibuprofen (ADVIL,MOTRIN) 600 MG tablet Take 1 tablet (600 mg total) by mouth every 6 (six) hours as needed. 03/11/14   [name], PA-C  methocarbamol (ROBAXIN) 500 MG tablet Take 1 tablet (500 mg total) by mouth 2 (two) times daily. 03/11/14   [name], PA-C  oxyCODONE-acetaminophen (PERCOCET/ROXICET) 5-325 MG per tablet Take 1-2 tablets by mouth every 6 (six) hours as needed for moderate pain or severe pain. 03/18/14   [name], PA-C   Triage Vitals: BP 113/70 mmHg  Pulse 63  Temp(Src) 98 F (36.7 C) (Oral)  Resp  18  Ht 6' (1.829 m)  Wt 175 lb (79.379 kg)  BMI 23.73 kg/m2  SpO2 98%  Physical Exam  Constitutional: He appears well-developed and well-nourished. No distress.  HENT:  Head: Normocephalic.  Mouth/Throat: Oropharynx is clear and moist. No oropharyngeal exudate.  Remote fracture to the upper central incisor, bilaterally. No  tenderness of gingiva. No other abnormality of dentition.  No laceration of the upper lip. No induration or fluctuance. No lymphadenopathy of the neck or head. Oropharynx is clear. No oral swelling.  Eyes: Conjunctivae are normal.  Neck: Neck supple.  Cardiovascular: Normal rate and regular rhythm.   Pulmonary/Chest: Effort normal and breath sounds normal. No respiratory distress. He has no wheezes. He has no rales.  Musculoskeletal: He exhibits edema and tenderness.  Left trapezius tender, swollen. Upper extremities:  Strength 5/5, sensation intact, distal pulses intact.    Lymphadenopathy:    He has no cervical adenopathy.  Neurological: He is alert.  Skin: No rash noted. He is not diaphoretic.  Nursing note and vitals reviewed.   ED Course  Procedures (including critical care time)  DIAGNOSTIC STUDIES: Oxygen Saturation is 98% on room air, normal by my interpretation.    COORDINATION OF CARE: At 1356 Discussed treatment plan with patient which includes apply ice. Patient agrees.   Labs Review Labs Reviewed - No data to display  Imaging Review No results found.   EKG Interpretation None      MDM   Final diagnoses:  Upper back pain on left side  Swollen upper lip    Afebrile, nontoxic patient with left upper back muscular tenderness.  Neurovascularly intact, no bony tenderness.  Also with slight upper lip swelling with unknown cause.  Does not appear to be related to infection, does not appear to be related to allergic reaction.  Also doubt angioedema.  Discussed strict return precautions with patient.   D/C home with ibuprofen, robaxin.  pcp follow up.  Discussed result, findings, treatment, and follow up  with patient.  Pt given return precautions.  Pt verbalizes understanding and agrees with plan.       I personally performed the services described in this documentation, which was scribed in my presence. The recorded information has been reviewed and is accurate.     Trixie Dredge, PA-C 07/10/14 1546  Mancel Bale, MD 07/10/14 [phone]

## 2014-07-10 NOTE — Discharge Instructions (Signed)
Read the information below.  Use the prescribed medication as directed.  Please discuss all new medications with your pharmacist.  You may return to the Emergency Department at any time for worsening condition or any new symptoms that concern you.    If you develop uncontrolled pain, weakness or numbness of the extremity, severe discoloration of the skin, or you are unable to use your arm, return to the ER for a recheck.   If you develop increased facial swelling, itching or swelling of the mouth or throat, or difficulty swallowing or breathing call 911 or return to the ER immediately for a recheck.    Musculoskeletal Pain Musculoskeletal pain is muscle and boney aches and pains. These pains can occur in any part of the body. Your caregiver may treat you without knowing the cause of the pain. They may treat you if blood or urine tests, X-rays, and other tests were normal.  CAUSES There is often not a definite cause or reason for these pains. These pains may be caused by a type of germ (virus). The discomfort may also come from overuse. Overuse includes working out too hard when your body is not fit. Boney aches also come from weather changes. Bone is sensitive to atmospheric pressure changes. HOME CARE INSTRUCTIONS   Ask when your test results will be ready. Make sure you get your test results.  Only take over-the-counter or prescription medicines for pain, discomfort, or fever as directed by your caregiver. If you were given medications for your condition, do not drive, operate machinery or power tools, or sign legal documents for 24 hours. Do not drink alcohol. Do not take sleeping pills or other medications that may interfere with treatment.  Continue all activities unless the activities cause more pain. When the pain lessens, slowly resume normal activities. Gradually increase the intensity and duration of the activities or exercise.  During periods of severe pain, bed rest may be helpful. Lay or  sit in any position that is comfortable.  Putting ice on the injured area.  Put ice in a bag.  Place a towel between your skin and the bag.  Leave the ice on for 15 to 20 minutes, 3 to 4 times a day.  Follow up with your caregiver for continued problems and no reason can be found for the pain. If the pain becomes worse or does not go away, it may be necessary to repeat tests or do additional testing. Your caregiver may need to look further for a possible cause. SEEK IMMEDIATE MEDICAL CARE IF:  You have pain that is getting worse and is not relieved by medications.  You develop chest pain that is associated with shortness or breath, sweating, feeling sick to your stomach (nauseous), or throw up (vomit).  Your pain becomes localized to the abdomen.  You develop any new symptoms that seem different or that concern you. MAKE SURE YOU:   Understand these instructions.  Will watch your condition.  Will get help right away if you are not doing well or get worse. Document Released: 02/27/2005 Document Revised: 05/22/2011 Document Reviewed: 11/01/2012 [facility] Patient Information 2015 [place]. This information is not intended to replace advice given to you by your health care provider. Make sure you discuss any questions you have with your health care provider.
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
## 2015-04-27 ENCOUNTER — Other Ambulatory Visit (HOSPITAL_COMMUNITY): Payer: Self-pay | Admitting: Gastroenterology

## 2015-04-27 ENCOUNTER — Ambulatory Visit (HOSPITAL_COMMUNITY): Payer: Self-pay

## 2015-04-27 DIAGNOSIS — Z0000 Encounter for general adult medical examination without abnormal findings: Secondary | ICD-10-CM

## 2015-06-14 ENCOUNTER — Encounter (HOSPITAL_COMMUNITY): Payer: Self-pay | Admitting: *Deleted

## 2015-06-14 ENCOUNTER — Emergency Department (HOSPITAL_COMMUNITY)
Admission: EM | Admit: 2015-06-14 | Discharge: 2015-06-14 | Disposition: A | Discharge status: Home / Self Care | Payer: No Typology Code available for payment source | Attending: Emergency Medicine | Admitting: Emergency Medicine

## 2015-06-14 ENCOUNTER — Emergency Department (HOSPITAL_COMMUNITY): Payer: No Typology Code available for payment source

## 2015-06-14 DIAGNOSIS — Y9289 Other specified places as the place of occurrence of the external cause: Secondary | ICD-10-CM | POA: Insufficient documentation

## 2015-06-14 DIAGNOSIS — F17210 Nicotine dependence, cigarettes, uncomplicated: Secondary | ICD-10-CM | POA: Insufficient documentation

## 2015-06-14 DIAGNOSIS — S60459A Superficial foreign body of unspecified finger, initial encounter: Secondary | ICD-10-CM

## 2015-06-14 DIAGNOSIS — Z23 Encounter for immunization: Secondary | ICD-10-CM | POA: Insufficient documentation

## 2015-06-14 DIAGNOSIS — S60351A Superficial foreign body of right thumb, initial encounter: Secondary | ICD-10-CM | POA: Insufficient documentation

## 2015-06-14 DIAGNOSIS — Y9389 Activity, other specified: Secondary | ICD-10-CM | POA: Insufficient documentation

## 2015-06-14 DIAGNOSIS — Y998 Other external cause status: Secondary | ICD-10-CM | POA: Insufficient documentation

## 2015-06-14 DIAGNOSIS — W458XXA Other foreign body or object entering through skin, initial encounter: Secondary | ICD-10-CM | POA: Insufficient documentation

## 2015-06-14 MED ORDER — TETANUS-DIPHTH-ACELL PERTUSSIS 5-2.5-18.5 LF-MCG/0.5 IM SUSP
0.5000 mL | Freq: Once | INTRAMUSCULAR | Status: AC
  Administered 2015-06-14: 0.5 mL via INTRAMUSCULAR
  Filled 2015-06-14: qty 0.5

## 2015-06-14 MED ORDER — LIDOCAINE HCL (PF) 1 % IJ SOLN
5.0000 mL | Freq: Once | INTRAMUSCULAR | Status: AC
  Administered 2015-06-14: 5 mL via INTRADERMAL
  Filled 2015-06-14: qty 5

## 2015-06-14 NOTE — ED Provider Notes (Signed)
CSN: [id number]     Arrival date & time 06/14/15  1455 History  By signing my name below, I, Tanda RockersMargaux Venter, attest that this documentation has been prepared under the direction and in the presence of Federated Department StoresHanna Patel-Mills, PA-C. Electronically Signed: Tanda RockersMargaux Venter, ED Scribe. 06/14/2015. 3:47 PM.   Chief Complaint  Patient presents with  . Finger Injury   The history is provided by the patient. No language interpreter was used.    HPI Comments: [name] [name] is a 30 y.o. male who presents to the Emergency Department complaining of sudden onset, constant, mild, right thumb pain x 2 days. Pt reports he was moving a basketball pole and felt a pin prick sensation to his finger. He believes there is a piece of metal in his thumb form the basketball pole causing the pain. Pt has not taken anything for the pain. He is not up to date on Tetanus. Denies weakness, numbness, tingling, or any other associated symptoms.   History reviewed. No pertinent past medical history. Past Surgical History  Procedure Laterality Date  . Arthroscopic repair acl    . Medial collateral ligament and lateral collateral ligament repair, knee     History reviewed. No pertinent family history. Social History  Substance Use Topics  . Smoking status: Current Some Day Smoker    Types: Cigarettes  . Smokeless tobacco: None  . Alcohol Use: Yes     Comment: occ    Review of Systems  Constitutional: Negative for fever.  Musculoskeletal: Positive for arthralgias (right thumb).  Neurological: Negative for weakness and numbness.      Allergies  Review of patient's allergies indicates no known allergies.  Home Medications   Prior to Admission medications   Medication Sig Start Date End Date Taking? Authorizing Provider  erythromycin ophthalmic ointment Place a 1/2 inch ribbon of ointment into the lower eyelid. 09/08/13  Yes [name], PA-C  HYDROcodone-acetaminophen (NORCO/VICODIN) 5-325 MG per tablet  Take 1-2 tablets by mouth every 4 (four) hours as needed for moderate pain or severe pain. 03/11/14  Yes [name], PA-C  ibuprofen (ADVIL,MOTRIN) 800 MG tablet Take 1 tablet (800 mg total) by mouth every 8 (eight) hours as needed for mild pain or moderate pain. 07/10/14  Yes [name], PA-C  methocarbamol (ROBAXIN) 500 MG tablet Take 1 tablet (500 mg total) by mouth every 6 (six) hours as needed for muscle spasms (and pain). 07/10/14  Yes [name], PA-C  oxyCODONE-acetaminophen (PERCOCET/ROXICET) 5-325 MG per tablet Take 1-2 tablets by mouth every 6 (six) hours as needed for moderate pain or severe pain. 03/18/14  Yes [name], PA-C   BP 138/83 mmHg  Pulse 63  Temp(Src) 98.4 F (36.9 C) (Oral)  Resp 16  SpO2 100%   Physical Exam  Constitutional: He is oriented to person, place, and time. He appears well-developed and well-nourished. No distress.  HENT:  Head: Normocephalic and atraumatic.  Eyes: Conjunctivae and EOM are normal.  Neck: Neck supple. No tracheal deviation present.  Cardiovascular: Normal rate.   Pulmonary/Chest: Effort normal. No respiratory distress.  Musculoskeletal: Normal range of motion.  2 mm black spot on the medial side of the thumb just beside the nail. No foreign body can be seen or palpated. Less than 2 seconds cap refill. Pt is abe to flex and extend all fingers.   Neurological: He is alert and oriented to person, place, and time.  Skin: Skin is warm and dry.  Psychiatric: He has a normal mood and  affect. His behavior is normal.  Nursing note and vitals reviewed.   ED Course  .Foreign Body Removal Date/Time: 06/14/2015 4:35 PM Performed by: Catha Gosselin Authorized by: Catha Gosselin Consent: Verbal consent obtained. Written consent obtained. Risks and benefits: risks, benefits and alternatives were discussed Consent given by: patient Patient understanding: patient states understanding of the procedure being performed Patient consent: the  patient's understanding of the procedure matches consent given Procedure consent: procedure consent matches procedure scheduled Relevant documents: relevant documents present and verified Test results: test results available and properly labeled Site marked: the operative site was marked Imaging studies: imaging studies available Patient identity confirmed: verbally with patient Body area: skin General location: upper extremity Location details: right thumb Anesthesia: local infiltration Local anesthetic: lidocaine 1% without epinephrine Anesthetic total: 0.5 ml Patient sedated: no Patient restrained: no Patient cooperative: yes Complexity: simple 1 objects recovered. Objects recovered: 1 Post-procedure assessment: foreign body removed Patient tolerance: Patient tolerated the procedure well with no immediate complications   (including critical care time)  DIAGNOSTIC STUDIES: Oxygen Saturation is 100% on RA, normal by my interpretation.    COORDINATION OF CARE: 3:47 PM-Discussed treatment plan which includes DG R Thumb with pt at bedside and pt agreed to plan.   Labs Review Labs Reviewed - No data to display  Imaging Review Dg Finger Thumb Right  06/14/2015  CLINICAL DATA:  Pain for 2 days after pre aching sensation EXAM: RIGHT THUMB 2+V COMPARISON:  None. FINDINGS: Frontal, oblique, and lateral views were obtained. There is a linear metallic foreign body measuring 4 mm in length located slightly volar and medial to the distal aspect of the first distal phalanx. No other radiopaque foreign body is evident. There is no fracture. There is slight subluxation at the first MCP joint on the frontal view without frank dislocation. There is no appreciable joint space narrowing or erosion. IMPRESSION: Linear metallic foreign body in the soft tissues slightly volar and medial to the distal first phalanx. Question degree of subluxation at the first MCP joint on the frontal view, not appreciable  on other views. There may be a degree of ligamentous instability in this area. Appropriate clinical evaluation of this area advised. No fracture or frank dislocation. No appreciable arthropathic change. Electronically Signed   By: Bretta Bang III M.D.   On: 06/14/2015 16:05   I have personally reviewed and evaluated these images as part of my medical decision-making.   EKG Interpretation None      MDM   Final diagnoses:  Foreign body of finger of right hand, initial encounter  Patient presented for possible foreign body in the right thumb. X-ray revealed when your metallic foreign body in the soft tissue of the first right phalanx. It was removed by me. No complications. I discussed return precautions for infection with the patient. He agrees with plan. I personally performed the services described in this documentation, which was scribed in my presence. The recorded information has been reviewed and is accurate.      Catha Gosselin, PA-C 06/15/15 1112  Laurence Spates, MD 06/15/15 [phone]

## 2015-06-14 NOTE — ED Notes (Addendum)
PT has a pin point spot on RT thumb since SAT. 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

## 2015-06-14 NOTE — ED Notes (Signed)
Declined W/C at D/C and was escorted to lobby by RN. 

## 2015-06-14 NOTE — Discharge Instructions (Signed)
Sliver Removal, Care After Return for drainage from the wound, erythema, or pain. A sliver--also called a splinter--is a small and thin broken piece of an object that gets stuck (embedded) under the skin. A sliver can create a deep wound that can easily become infected. It is important to care for the wound after a sliver is removed to help prevent infection and other problems from developing. WHAT TO EXPECT AFTER THE PROCEDURE Slivers often break into smaller pieces when they are removed. If pieces of your sliver broke off and stayed in your skin, you will eventually see them working themselves out and you may feel some pain at the wound site. This is normal. HOME CARE INSTRUCTIONS  Keep all follow-up visits as directed by your health care provider. This is important.  There are many different ways to close and cover a wound, including stitches (sutures) and adhesive strips. Follow your health care provider's instructions about:  Wound care.  Bandage (dressing) changes and removal.  Wound closure removal.  Check the wound site every day for signs of infection. Watch for:  Red streaks coming from the wound.  Fever.  Redness or tenderness around the wound.  Fluid, blood, or pus coming from the wound.  A bad smell coming from the wound. SEEK MEDICAL CARE IF:  You think that a piece of the sliver is still in your skin.  Your wound was closed, as with sutures, and the edges of the wound break open.  You have signs of infection, including:  New or worsening redness around the wound.  New or worsening tenderness around the wound.  Fluid, blood, or pus coming from the wound.  A bad smell coming from the wound or dressing. SEEK IMMEDIATE MEDICAL CARE IF: You have any of the following signs of infection:  Red streaks coming from the wound.  An unexplained fever.   This information is not intended to replace advice given to you by your health care provider. Make sure you  discuss any questions you have with your health care provider.   Document Released: 02/25/2000 Document Revised: 03/20/2014 Document Reviewed: 10/30/2013 Elsevier Interactive Patient Education [employer]vier Inc.

## 2015-10-19 ENCOUNTER — Emergency Department (HOSPITAL_COMMUNITY)
Admission: EM | Admit: 2015-10-19 | Discharge: 2015-10-19 | Disposition: A | Discharge status: Home / Self Care | Payer: No Typology Code available for payment source | Attending: Emergency Medicine | Admitting: Emergency Medicine

## 2015-10-19 ENCOUNTER — Encounter (HOSPITAL_COMMUNITY): Payer: Self-pay | Admitting: Emergency Medicine

## 2015-10-19 ENCOUNTER — Emergency Department (HOSPITAL_COMMUNITY): Payer: No Typology Code available for payment source

## 2015-10-19 DIAGNOSIS — Y999 Unspecified external cause status: Secondary | ICD-10-CM | POA: Insufficient documentation

## 2015-10-19 DIAGNOSIS — M542 Cervicalgia: Secondary | ICD-10-CM | POA: Diagnosis not present

## 2015-10-19 DIAGNOSIS — Z79899 Other long term (current) drug therapy: Secondary | ICD-10-CM | POA: Insufficient documentation

## 2015-10-19 DIAGNOSIS — S63601A Unspecified sprain of right thumb, initial encounter: Secondary | ICD-10-CM | POA: Insufficient documentation

## 2015-10-19 DIAGNOSIS — F17210 Nicotine dependence, cigarettes, uncomplicated: Secondary | ICD-10-CM | POA: Insufficient documentation

## 2015-10-19 DIAGNOSIS — Y939 Activity, unspecified: Secondary | ICD-10-CM | POA: Diagnosis not present

## 2015-10-19 DIAGNOSIS — M549 Dorsalgia, unspecified: Secondary | ICD-10-CM | POA: Insufficient documentation

## 2015-10-19 DIAGNOSIS — S6991XA Unspecified injury of right wrist, hand and finger(s), initial encounter: Secondary | ICD-10-CM | POA: Diagnosis present

## 2015-10-19 DIAGNOSIS — Y92410 Unspecified street and highway as the place of occurrence of the external cause: Secondary | ICD-10-CM | POA: Diagnosis not present

## 2015-10-19 MED ORDER — IBUPROFEN 800 MG PO TABS: 800.0000 mg | ORAL_TABLET | Freq: Three times a day (TID) | ORAL | 0 refills | Status: DC | PRN

## 2015-10-19 MED ORDER — METHOCARBAMOL 500 MG PO TABS: 500.0000 mg | ORAL_TABLET | Freq: Four times a day (QID) | ORAL | 0 refills | Status: DC | PRN

## 2015-10-19 NOTE — ED Notes (Signed)
Patient transported to X-ray 
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
## 2015-10-25 ENCOUNTER — Emergency Department (HOSPITAL_COMMUNITY)
Admission: EM | Admit: 2015-10-25 | Discharge: 2015-10-25 | Disposition: A | Discharge status: Home / Self Care | Payer: No Typology Code available for payment source | Attending: Emergency Medicine | Admitting: Emergency Medicine

## 2015-10-25 DIAGNOSIS — F17210 Nicotine dependence, cigarettes, uncomplicated: Secondary | ICD-10-CM | POA: Diagnosis not present

## 2015-10-25 DIAGNOSIS — M79641 Pain in right hand: Secondary | ICD-10-CM | POA: Insufficient documentation

## 2015-10-25 DIAGNOSIS — M7989 Other specified soft tissue disorders: Secondary | ICD-10-CM | POA: Insufficient documentation

## 2015-10-25 NOTE — ED Notes (Signed)
Declined W/C at D/C and was escorted to lobby by RN. 

## 2015-10-25 NOTE — ED Triage Notes (Signed)
Needs work note for hand pain
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
## 2015-11-16 ENCOUNTER — Telehealth (HOSPITAL_BASED_OUTPATIENT_CLINIC_OR_DEPARTMENT_OTHER): Payer: Self-pay | Admitting: Emergency Medicine

## 2015-12-10 ENCOUNTER — Emergency Department (HOSPITAL_COMMUNITY)
Admission: EM | Admit: 2015-12-10 | Discharge: 2015-12-10 | Disposition: A | Discharge status: Home / Self Care | Payer: Self-pay | Attending: Emergency Medicine | Admitting: Emergency Medicine

## 2015-12-10 ENCOUNTER — Emergency Department (HOSPITAL_COMMUNITY): Payer: Self-pay

## 2015-12-10 ENCOUNTER — Encounter (HOSPITAL_COMMUNITY): Payer: Self-pay | Admitting: *Deleted

## 2015-12-10 DIAGNOSIS — Y9301 Activity, walking, marching and hiking: Secondary | ICD-10-CM | POA: Insufficient documentation

## 2015-12-10 DIAGNOSIS — Y929 Unspecified place or not applicable: Secondary | ICD-10-CM | POA: Insufficient documentation

## 2015-12-10 DIAGNOSIS — Z87891 Personal history of nicotine dependence: Secondary | ICD-10-CM | POA: Insufficient documentation

## 2015-12-10 DIAGNOSIS — M79644 Pain in right finger(s): Secondary | ICD-10-CM | POA: Insufficient documentation

## 2015-12-10 DIAGNOSIS — W109XXA Fall (on) (from) unspecified stairs and steps, initial encounter: Secondary | ICD-10-CM | POA: Insufficient documentation

## 2015-12-10 DIAGNOSIS — Y999 Unspecified external cause status: Secondary | ICD-10-CM | POA: Insufficient documentation

## 2015-12-10 MED ORDER — ACETAMINOPHEN 325 MG PO TABS
650.0000 mg | ORAL_TABLET | Freq: Once | ORAL | Status: AC
  Administered 2015-12-10: 650 mg via ORAL
  Filled 2015-12-10: qty 2

## 2015-12-10 NOTE — Discharge Instructions (Signed)
Read the information below.  Your x-rays did not show any obvious fracture or dislocation. You were placed in a finger splint.  I encourage you to take tylenol 650mg  every 6hrs or ibuprofen 400mg  every 6hrs for pain relief.  Ice affected area for 20 minute increments for the next 48 hours.  Be sure to follow up with your primary doctor if symptoms persist. I have provided the contact information for [facility]. You may return to the Emergency Department at any time for worsening condition or any new symptoms that concern you. Return to ED if you develop fever, change in color of your skin, or loss of feeling.

## 2015-12-10 NOTE — ED Triage Notes (Signed)
States he fell last pm and attempted to stop his fall c/o pain in right index finger

## 2015-12-10 NOTE — ED Provider Notes (Signed)
[facility] Provider Note   CSN: [id number] Arrival date & time: 12/10/15  9629  By signing my name below, I, [name] [name], attest that this documentation has been prepared under the direction and in the presence of [employer] PA-C. Electronically Signed: Sonum [name], Neurosurgeon. 12/10/15. 10:01 AM.  History   Chief Complaint Chief Complaint  Patient presents with  . Fall  . Hand Injury    The history is provided by the patient. No language interpreter was used.     HPI Comments: [name] [name] is a 30 y.o. male who presents to the Emergency Department complaining of right index finger pain with associated swelling that began after a fall last night. Patient states he was walking down steps when he fell and broke his fall with his right hand. He describes his pain as sharp and throbbing; states it is worse with movement of the affected area. He applied ice last night with minimal relief. He has not tried any OTC pain medication for his symptoms.He denies fever, redness, warmth, numbness. No head trauma. Pt is right hand dominant.  History reviewed. No pertinent past medical history.  There are no active problems to display for this patient.   Past Surgical History:  Procedure Laterality Date  . ARTHROSCOPIC REPAIR ACL    . MEDIAL COLLATERAL LIGAMENT AND LATERAL COLLATERAL LIGAMENT REPAIR, KNEE         Home Medications    Prior to Admission medications   Medication Sig Start Date End Date Taking? Authorizing Provider  erythromycin ophthalmic ointment Place a 1/2 inch ribbon of ointment into the lower eyelid. 09/08/13   [name], PA-C  HYDROcodone-acetaminophen (NORCO/VICODIN) 5-325 MG per tablet Take 1-2 tablets by mouth every 4 (four) hours as needed for moderate pain or severe pain. 03/11/14   Junius Finner, PA-C  ibuprofen (ADVIL,MOTRIN) 800 MG tablet Take 1 tablet (800 mg total) by mouth every 8 (eight) hours as needed for mild pain or moderate pain.  10/19/15   Fayrene Helper, PA-C  methocarbamol (ROBAXIN) 500 MG tablet Take 1 tablet (500 mg total) by mouth every 6 (six) hours as needed for muscle spasms (and pain). 10/19/15   Fayrene Helper, PA-C  oxyCODONE-acetaminophen (PERCOCET/ROXICET) 5-325 MG per tablet Take 1-2 tablets by mouth every 6 (six) hours as needed for moderate pain or severe pain. 03/18/14   Junius Finner, PA-C    Family History History reviewed. No pertinent family history.  Social History Social History  Substance Use Topics  . Smoking status: Former Smoker    Types: Cigarettes  . Smokeless tobacco: Never Used  . Alcohol use Yes     Comment: occ     Allergies   Review of patient's allergies indicates no known allergies.   Review of Systems Review of Systems  Constitutional: Negative for fever.  Musculoskeletal: Positive for arthralgias and joint swelling.  Neurological: Negative for weakness and numbness.     Physical Exam Updated Vital Signs BP 123/65 (BP Location: Right Arm)   Pulse 73   Temp 97.9 F (36.6 C) (Oral)   Resp 18   SpO2 99%   Physical Exam  Constitutional: He appears well-developed and well-nourished. No distress.  HENT:  Head: Normocephalic and atraumatic.  Eyes: Conjunctivae are normal. No scleral icterus.  Neck: Normal range of motion.  Cardiovascular: Normal rate.   Pulmonary/Chest: Effort normal. No respiratory distress.  Musculoskeletal: He exhibits tenderness. He exhibits no deformity.  Right hand: No obvious deformity. No abrasions or lacerations. No appreciable  swelling, ecchymosis, erythema, or warmth. Tenderness to palpation to the 2nd metacarpal, MCP joint, 2nd digit. Active ROM decreased most likely secondary to pain. Passive ROM intact. Sensation and sensation is intact. Capillary refill <3seconds. Nml right wrist.   Neurological: He is alert. He is not disoriented. GCS eye subscore is 4. GCS verbal subscore is 5. GCS motor subscore is 6.  Skin: Skin is warm and dry. He is not  diaphoretic.  Psychiatric: He has a normal mood and affect. His behavior is normal.  Nursing note and vitals reviewed.    ED Treatments / Results  DIAGNOSTIC STUDIES: Oxygen Saturation is 99% on RA, normal by my interpretation.    COORDINATION OF CARE: 10:01 AM Discussed treatment plan with pt at bedside and pt agreed to plan.   Labs (all labs ordered are listed, but only abnormal results are displayed) Labs Reviewed - No data to display  EKG  EKG Interpretation None       Radiology Dg Hand Complete Right  Result Date: 12/10/2015 CLINICAL DATA:  Status post fall last night with pain about the second and third MCP joints of the right hand. Initial encounter. EXAM: RIGHT HAND - COMPLETE 3+ VIEW COMPARISON:  Plain films right hand 0 8/0 this 10/2015. FINDINGS: There is no evidence of fracture or dislocation. There is no evidence of arthropathy or other focal bone abnormality. Soft tissues are unremarkable. IMPRESSION: Normal exam. Electronically Signed   By: Drusilla Kannerhomas  Dalessio M.D.   On: 12/10/2015 10:29   Dg Finger Index Right  Result Date: 12/10/2015 CLINICAL DATA:  Larey SeatFell this morning with pain at the index finger MCP joint. EXAM: RIGHT INDEX FINGER 2+V COMPARISON:  10/19/2015 FINDINGS: There is no evidence of fracture or dislocation. There is no evidence of arthropathy or other focal bone abnormality. Soft tissues are unremarkable. IMPRESSION: No acute abnormality. Electronically Signed   By: Richarda OverlieAdam  Henn M.D.   On: 12/10/2015 09:56    Procedures Procedures (including critical care time)  Medications Ordered in ED Medications  acetaminophen (TYLENOL) tablet 650 mg (650 mg Oral Given 12/10/15 1039)     Initial Impression / Assessment and Plan / ED Course  I have reviewed the triage vital signs and the nursing notes.  Pertinent labs & imaging results that were available during my care of the patient were reviewed by me and considered in my medical decision making (see chart for  details).  Clinical Course  Comment By Time  Review of x-ray show no obvious fracture or dislocation.  Lona Kettleshley Laurel Kimani Hovis, [place]PA-C 09/29 1021    Patient presents to ED with complaint of right index finger pain s/p fall. Patient is afebrile and non-toxic appearing in NAD. VSS. No appreciable swelling or deformity noted to right hand. TTP of right 2nd metacarpal, MCP joint, and digit. Neurovascularly intact. X-ray negative for fracture or dislocation. Pain medicine given in ED. Discussed results and plan with pt. Pt placed in finger splint. Symptomatic management discussed. Follow up with PCP if sxs persist. Return precautions given. Pt voiced understanding and is agreeable.    Final Clinical Impressions(s) / ED Diagnoses   Final diagnoses:  Finger pain, right    New Prescriptions New Prescriptions   No medications on file   I personally performed the services described in this documentation, which was scribed in my presence. The recorded information has been reviewed and is accurate.    Lona KettleAshley Laurel Helyne Genther, PA-C 12/10/15 [address]1046    Nhung Danko Laurel PicachoMeyer, [place]PA-C 12/10/15 1050  Nelva Nay, MD 12/11/15 [phone]

## 2016-01-14 ENCOUNTER — Ambulatory Visit: Payer: No Typology Code available for payment source | Attending: Orthopedic Surgery | Admitting: Occupational Therapy

## 2016-01-14 DIAGNOSIS — R600 Localized edema: Secondary | ICD-10-CM | POA: Diagnosis present

## 2016-01-14 DIAGNOSIS — M6281 Muscle weakness (generalized): Secondary | ICD-10-CM | POA: Diagnosis present

## 2016-01-14 DIAGNOSIS — M25641 Stiffness of right hand, not elsewhere classified: Secondary | ICD-10-CM | POA: Diagnosis present

## 2016-01-14 DIAGNOSIS — M79641 Pain in right hand: Secondary | ICD-10-CM | POA: Diagnosis present

## 2016-01-14 NOTE — Patient Instructions (Signed)
MP Flexion (Active Isolated)   Bend ______ finger at large knuckle, keeping other fingers straight. Do not bend tips. Repeat _10-15___ times. Do __4-6__ sessions per day.  AROM: PIP Flexion / Extension   Pinch bottom knuckle of _index/ all_______ finger of hand to prevent bending. Actively bend middle knuckle until stretch is felt. Hold __5__ seconds. Relax. Straighten finger as far as possible. Repeat __10-15__ times per set. Do _4-6___ sessions per day.   AROM: DIP Flexion / Extension   Pinch middle knuckle of ____index____ finger of  hand to prevent bending. Bend end knuckle until stretch is felt. Hold _5___ seconds. Relax. Straighten finger as far as possible. Repeat _10-15___ times per set.  Do _4-6___ sessions per day.  AROM: Finger Flexion / Extension   Actively bend fingers of  hand. Start with knuckles furthest from palm, and slowly make a fist. Hold __5__ seconds. Relax. Then straighten fingers as far as possible. Repeat _10-15___ times per set.  Do _4-6___ sessions per day.  Copyright  VHI. All rights reserved.      Opposition (Active)   Touch tip of thumb to nail tip of each finger in turn, making an "O" shape. Repeat __10__ times. Do _4-6___ sessions per day.   MP Flexion (Active)   Bend thumb to touch base of little finger, keeping tip joint straight. Repeat __10-15__ times. Do _4-6___ sessions per day.       IP Flexion (Active Blocked)   Brace thumb below tip joint. Bend joint as far as possible. Repeat __10__ times. Do _4-6___ sessions per day.   Composite Extension (Active)          Bring thumb up and out in hitchhiker position.  Repeat __10-15__ times. Do _4-6___ sessions per day.     Wear glove at night time to reduce swelling, remove glove if it feels too tight or appears to be cutting off circulation.  Wear thumb brace during the day particularly if you are out in public or you need to use your right hand for light  activities. Do not lift anything heavy!
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
## 2016-01-18 ENCOUNTER — Ambulatory Visit: Payer: No Typology Code available for payment source | Admitting: Occupational Therapy

## 2016-01-18 DIAGNOSIS — M79641 Pain in right hand: Secondary | ICD-10-CM | POA: Diagnosis not present

## 2016-01-18 DIAGNOSIS — M6281 Muscle weakness (generalized): Secondary | ICD-10-CM

## 2016-01-18 DIAGNOSIS — M25641 Stiffness of right hand, not elsewhere classified: Secondary | ICD-10-CM

## 2016-01-18 DIAGNOSIS — R600 Localized edema: Secondary | ICD-10-CM

## 2016-01-18 NOTE — Therapy (Signed)
[facility] [address] [place], [place], [address] Phone: [phone]   Fax:  [phone]  Occupational Therapy Treatment  Patient Details  Name: [name] [name] MRN: [id number] Date of Birth: 1985/12/03 Referring Provider: Dr. Shanda BumpsSeth Jaffe  Encounter Date: 01/18/2016      OT End of Session - 01/18/16 1128    Visit Number 2   Number of Visits 17   Date for OT Re-Evaluation 03/14/15   Authorization Type self pay/medpay   OT Start Time 1107   OT Stop Time 1153   OT Time Calculation (min) 46 min   Activity Tolerance Patient tolerated treatment well   Behavior During Therapy [facility] for tasks assessed/performed      No past medical history on file.  Past Surgical History:  Procedure Laterality Date  . ARTHROSCOPIC REPAIR ACL    . MEDIAL COLLATERAL LIGAMENT AND LATERAL COLLATERAL LIGAMENT REPAIR, KNEE      There were no vitals filed for this visit.      Subjective Assessment - 01/18/16 1118    Subjective  Pt reports his pain and swelling are improving   Pertinent History see epic   Patient Stated Goals regain use of right hand to return to work   Currently in Pain? Yes   Pain Score 7    Pain Location Hand   Pain Orientation Right   Pain Type Acute pain   Pain Onset More than a month ago   Pain Frequency Constant   Aggravating Factors  movement   Pain Relieving Factors fluidotherapy   Effect of Pain on Daily Activities limits daily activities   Multiple Pain Sites No         Treatment: Fluidotherapy x10 mins for pain and stiffness, no adverse reactions. Reviewed A/ROM to thumb for MP and IP flexion, opposition, Tendon gliding exercises for digits, with A/ROM abduct/ adduct A/ROM, AA/ROM Composite flexion/ extension, reverse blocking and P/ROM MP flexion Ice pack x 8 mins for edema control. No adverse reactions                      OT Education - 01/18/16 1210    Education  provided Yes   Education Details P/ROM MCP flexion   Person(s) Educated Patient   Methods Explanation;Demonstration;Verbal cues;Handout   Comprehension Verbalized understanding;Returned demonstration;Verbal cues required          OT Short Term Goals - 01/14/16 1308      OT SHORT TERM GOAL #1   Title I with inital HEP.   Time 4   Period Weeks   Status New     OT SHORT TERM GOAL #2   Title Pt will verbalize understanding of pain reduction and edema control techniques.   Time 4   Period Weeks   Status New     OT SHORT TERM GOAL #3   Title Pt will demonstrate right index finger MP flexion 65*, PIP 80, and DIP 25 in prep for improved functional use.   Baseline MP 40, PIP 70, DIP 10   Time 4   Period Weeks   Status New     OT SHORT TERM GOAL #4   Title Pt will use RUE for light activities/ ADLs at least 50% of the time with pain lesss than or equal to 4/10.   Time 4   Period Weeks   Status New           OT Long Term Goals - 01/14/16 1314  OT LONG TERM GOAL #1   Title I with updated HEP.   Time 8   Period Weeks   Status New     OT LONG TERM GOAL #2   Title Pt will demonstrate full composite finger and thumb flexion WFLS for RUE with pain no greater than 4/10.   Time 8   Period Weeks   Status New     OT LONG TERM GOAL #3   Title Pt will demonstrate RUE grip strength of at least 40 lbs for RUE functional use.   Time 8   Period Weeks   Status New     OT LONG TERM GOAL #4   Title Pt will resume use of RUE as dominant hand at least 90% of the time for ADLs/ IADLs.   Time 8   Period Weeks   Status New     OT LONG TERM GOAL #5   Title Pt will perform simulated work activities safely and independently using RUE.   Time 8   Period Weeks   Status New               Plan - 01/18/16 1120    Clinical Impression Statement Pt is progressing towards goals. He reports decreased overall pain and edema.   Rehab Potential Good   OT Frequency 2x / week    OT Duration 8 weeks   OT Treatment/Interventions Self-care/ADL training;Moist Heat;Fluidtherapy;DME and/or AE instruction;Splinting;Patient/family education;Therapeutic exercises;Contrast Bath;Ultrasound;Therapeutic exercise;Therapeutic activities;Passive range of motion;Neuromuscular education;Cryotherapy;Iontophoresis;Parrafin;Manual Therapy   Plan continue modalities, A/ROM, AA/ROM   OT Home Exercise Plan issued A/ROM to hand and thumb      Patient will benefit from skilled therapeutic intervention in order to improve the following deficits and impairments:  Decreased coordination, Decreased range of motion, Impaired flexibility, Impaired sensation, Increased edema, Decreased endurance, Decreased activity tolerance, Pain, Impaired UE functional use, Decreased knowledge of use of DME, Decreased strength, Impaired perceived functional ability  Visit Diagnosis: Pain in right hand  Stiffness of right hand, not elsewhere classified  Localized edema  Muscle weakness (generalized)    Problem List There are no active problems to display for this patient.   Vivyan Biggers 01/18/2016, 12:12 PM  [facility] [address] [place], [place], [address] Phone: [phone]   Fax:  [phone]  Name: [name] [name] MRN: [id number] Date of Birth: 27-Mar-1985

## 2016-01-18 NOTE — Patient Instructions (Signed)
PROM: Finger MP Joints   Passively bend __index______ finger of hand at big knuckle until stretch is felt. Hold _10___ seconds. Relax. Straighten finger as far as possible. Repeat __5__ times per set.  Do __4-6__ sessions per day.

## 2016-01-24 ENCOUNTER — Encounter: Payer: Self-pay | Admitting: *Deleted

## 2016-01-24 ENCOUNTER — Ambulatory Visit: Payer: No Typology Code available for payment source | Admitting: *Deleted

## 2016-01-24 DIAGNOSIS — M6281 Muscle weakness (generalized): Secondary | ICD-10-CM

## 2016-01-24 DIAGNOSIS — R600 Localized edema: Secondary | ICD-10-CM

## 2016-01-24 DIAGNOSIS — M79641 Pain in right hand: Secondary | ICD-10-CM | POA: Diagnosis not present

## 2016-01-24 DIAGNOSIS — M25641 Stiffness of right hand, not elsewhere classified: Secondary | ICD-10-CM

## 2016-01-24 NOTE — Therapy (Signed)
[facility] [address] [place], [place], [address] Phone: [phone]   Fax:  [phone]  Occupational Therapy Treatment  Patient Details  Name: [name] Blenderimothy J Buxbaum MRN: [id number] Date of Birth: 03-12-1986 Referring Provider: Dr. Shanda BumpsSeth Jaffe  Encounter Date: 01/24/2016      OT End of Session - 01/24/16 1157    Visit Number 3   Number of Visits 17   Date for OT Re-Evaluation 03/14/15   Authorization Type self pay/medpay   OT Start Time 1112   OT Stop Time 1152   OT Time Calculation (min) 40 min   Activity Tolerance Patient tolerated treatment well   Behavior During Therapy [facility] for tasks assessed/performed      History reviewed. No pertinent past medical history.  Past Surgical History:  Procedure Laterality Date  . ARTHROSCOPIC REPAIR ACL    . MEDIAL COLLATERAL LIGAMENT AND LATERAL COLLATERAL LIGAMENT REPAIR, KNEE      There were no vitals filed for this visit.      Subjective Assessment - 01/24/16 1114    Subjective  Pt reports pain 7/10 with some pain and swelling    Pertinent History see epic   Patient Stated Goals regain use of right hand to return to work   Currently in Pain? Yes   Pain Score 7    Pain Location Hand   Pain Orientation Right   Pain Descriptors / Indicators Aching   Pain Type Acute pain   Pain Onset More than a month ago   Pain Frequency Constant   Aggravating Factors  Movement   Pain Relieving Factors Fluidotherapy   Effect of Pain on Daily Activities Limits daily activities   Multiple Pain Sites No                      OT Treatments/Exercises (OP) - 01/24/16 0001      Exercises   Exercises Hand     Hand Exercises   Other Hand Exercises AROM right thumb for MP and IP flexion, opposition, Tendon gliding exercises, A/ROM ABD/ADD of fingers, AA/ROM composite flexion/extension fingers, reverse blocking exercises and P/ROM MP Flexion/composite flexion.    Other Hand Exercises Verbal reivew of home program, edema control techniques. Instructed pt in self retrograde massage techniques, elevation, A/ROM and cryotherapy PRN during the day to assist with swelling R hand. Pt verbalized understanding in clinic today.     Modalities   Modalities Ultrasound;Fluidotherapy     Ultrasound   Ultrasound Location Right dorsal thumb and IF's    Ultrasound Parameters pulsed US 3 Mhz @ 0.8 w/cm2 x8 min    Ultrasound Goals Edema;Pain;Other (Comment)  Increase circulation     RUE Fluidotherapy   Number Minutes Fluidotherapy 10 Minutes   RUE Fluidotherapy Location Hand;Wrist   Comments Pt performing AROM ex's while in fluidotherapy (tendon gliding, opposition and wrist AROM) to assist with increased flexibility and ROM, decreased pain./stiffness.                OT Education - 01/24/16 1155    Education provided Yes   Education Details Reviewed HEP, edema control techniques, pulsed US   Person(s) Educated Patient   Methods Explanation;Demonstration;Verbal cues   Comprehension Verbalized understanding;Returned demonstration          OT Short Term Goals - 01/14/16 1308      OT SHORT TERM GOAL #1   Title I with inital HEP.   Time 4   Period Weeks  Status New     OT SHORT TERM GOAL #2   Title Pt will verbalize understanding of pain reduction and edema control techniques.   Time 4   Period Weeks   Status New     OT SHORT TERM GOAL #3   Title Pt will demonstrate right index finger MP flexion 65*, PIP 80, and DIP 25 in prep for improved functional use.   Baseline MP 40, PIP 70, DIP 10   Time 4   Period Weeks   Status New     OT SHORT TERM GOAL #4   Title Pt will use RUE for light activities/ ADLs at least 50% of the time with pain lesss than or equal to 4/10.   Time 4   Period Weeks   Status New           OT Long Term Goals - 01/14/16 1314      OT LONG TERM GOAL #1   Title I with updated HEP.   Time 8   Period Weeks    Status New     OT LONG TERM GOAL #2   Title Pt will demonstrate full composite finger and thumb flexion WFLS for RUE with pain no greater than 4/10.   Time 8   Period Weeks   Status New     OT LONG TERM GOAL #3   Title Pt will demonstrate RUE grip strength of at least 40 lbs for RUE functional use.   Time 8   Period Weeks   Status New     OT LONG TERM GOAL #4   Title Pt will resume use of RUE as dominant hand at least 90% of the time for ADLs/ IADLs.   Time 8   Period Weeks   Status New     OT LONG TERM GOAL #5   Title Pt will perform simulated work activities safely and independently using RUE.   Time 8   Period Weeks   Status New               Plan - 01/24/16 1157    Clinical Impression Statement Pt is progressing toward goals, reporting decreased pain right hand. Edema persists along right dorsal IF and thmub MCP areas. Pt was instructed in edema control techniques for right hand & this should assist with this.    Rehab Potential Good   OT Frequency 2x / week   OT Duration 8 weeks   OT Treatment/Interventions Self-care/ADL training;Moist Heat;Fluidtherapy;DME and/or AE instruction;Splinting;Patient/family education;Therapeutic exercises;Contrast Bath;Ultrasound;Therapeutic exercise;Therapeutic activities;Passive range of motion;Neuromuscular education;Cryotherapy;Iontophoresis;Parrafin;Manual Therapy   Plan Continue modalities, A/ROM and AA/ROM, ?gentle strengthening right hand for grip/pinch as able.   Consulted and Agree with Plan of Care Patient      Patient will benefit from skilled therapeutic intervention in order to improve the following deficits and impairments:  Decreased coordination, Decreased range of motion, Impaired flexibility, Impaired sensation, Increased edema, Decreased endurance, Decreased activity tolerance, Pain, Impaired UE functional use, Decreased knowledge of use of DME, Decreased strength, Impaired perceived functional ability  Visit  Diagnosis: Pain in right hand  Stiffness of right hand, not elsewhere classified  Localized edema  Muscle weakness (generalized)    Problem List There are no active problems to display for this patient.   Mariam Dollar Beth Dixon , OTR/L 01/24/2016, 12:02 PM  [facility] [facility] [address] [place], [place], [address] Phone: [phone]   Fax:  [phone]  Name: HAEDYN ANCRUM MRN: [id number]  Date of Birth: [birth year]

## 2016-01-25 ENCOUNTER — Ambulatory Visit: Payer: No Typology Code available for payment source | Admitting: Occupational Therapy

## 2016-01-27 ENCOUNTER — Ambulatory Visit: Payer: No Typology Code available for payment source | Admitting: Occupational Therapy

## 2016-01-27 DIAGNOSIS — M79641 Pain in right hand: Secondary | ICD-10-CM | POA: Diagnosis not present

## 2016-01-27 DIAGNOSIS — M6281 Muscle weakness (generalized): Secondary | ICD-10-CM

## 2016-01-27 DIAGNOSIS — M25641 Stiffness of right hand, not elsewhere classified: Secondary | ICD-10-CM

## 2016-01-27 DIAGNOSIS — R600 Localized edema: Secondary | ICD-10-CM

## 2016-01-27 NOTE — Therapy (Signed)
[facility] [address] [place], [place], [address] Phone: [phone]   Fax:  [phone]  Occupational Therapy Treatment  Patient Details  Name: [name] [name] MRN: [id number] Date of Birth: 1986-01-12 Referring Provider: Dr. Shanda BumpsSeth Jaffe  Encounter Date: 01/27/2016      OT End of Session - 01/27/16 1825    Visit Number 4   Number of Visits 17   Date for OT Re-Evaluation 03/14/15   Authorization Type self pay/medpay   OT Start Time 1533   OT Stop Time 1615   OT Time Calculation (min) 42 min   Activity Tolerance Patient tolerated treatment well   Behavior During Therapy [facility] for tasks assessed/performed      No past medical history on file.  Past Surgical History:  Procedure Laterality Date  . ARTHROSCOPIC REPAIR ACL    . MEDIAL COLLATERAL LIGAMENT AND LATERAL COLLATERAL LIGAMENT REPAIR, KNEE      There were no vitals filed for this visit.      Subjective Assessment - 01/27/16 1823    Subjective  Pt reports pain 6/10 , pt reports he sees MD next wed   Pertinent History see epic   Patient Stated Goals regain use of right hand to return to work   Currently in Pain? Yes   Pain Score 6    Pain Location Hand   Pain Orientation Right   Pain Descriptors / Indicators Aching   Pain Type Acute pain   Pain Onset More than a month ago   Pain Frequency Constant   Aggravating Factors  movement   Pain Relieving Factors fluidotherapy   Effect of Pain on Daily Activities limits daily activities              Exercises   Exercises Hand     Hand Exercises   Other Hand Exercises AROM right thumb for MP and IP flexion, opposition, Tendon gliding exercises, A/ROM  flexion.  Pt was instructed in yellow theraputty HEP for composite grip, 3 pt and tip pinch. Pt returned demonstration. Pt was instructed only to perform 1x day for approx 20 reps each.             Ultrasound;Fluidotherapy     Ultrasound    Ultrasound Location Right dorsal thumb and IF's    Ultrasound Parameters pulsed US 3 Mhz @ 0.5 w/cm2 x8 min    Ultrasound Goals Edema;Pain;Other (Comment)  Increase circulation     RUE Fluidotherapy   Number Minutes Fluidotherapy 10 Minutes   RUE Fluidotherapy Location Hand;Wrist   Comments Pt performing AROM ex's while in fluidotherapy (tendon gliding, opposition and wrist AROM) to assist with increased flexibility and ROM, decreased pain./stiffness.                               OT Short Term Goals - 01/27/16 1825      OT SHORT TERM GOAL #1   Title I with inital HEP.   Time 4   Period Weeks   Status Achieved     OT SHORT TERM GOAL #2   Title Pt will verbalize understanding of pain reduction and edema control techniques.   Time 4   Period Weeks   Status Achieved     OT SHORT TERM GOAL #3   Title Pt will demonstrate right index finger MP flexion 65*, PIP 80, and DIP 25 in prep for improved functional use.   Baseline MP  40, PIP 70, DIP 10   Time 4   Period Weeks   Status On-going     OT SHORT TERM GOAL #4   Title Pt will use RUE for light activities/ ADLs at least 50% of the time with pain lesss than or equal to 4/10.   Time 4   Period Weeks   Status On-going           OT Long Term Goals - 01/14/16 1314      OT LONG TERM GOAL #1   Title I with updated HEP.   Time 8   Period Weeks   Status New     OT LONG TERM GOAL #2   Title Pt will demonstrate full composite finger and thumb flexion WFLS for RUE with pain no greater than 4/10.   Time 8   Period Weeks   Status New     OT LONG TERM GOAL #3   Title Pt will demonstrate RUE grip strength of at least 40 lbs for RUE functional use.   Time 8   Period Weeks   Status New     OT LONG TERM GOAL #4   Title Pt will resume use of RUE as dominant hand at least 90% of the time for ADLs/ IADLs.   Time 8   Period Weeks   Status New     OT LONG TERM GOAL #5   Title Pt will perform simulated  work activities safely and independently using RUE.   Time 8   Period Weeks   Status New             Patient will benefit from skilled therapeutic intervention in order to improve the following deficits and impairments:     Visit Diagnosis: No diagnosis found.    Problem List There are no active problems to display for this patient.   RINE,KATHRYN 01/27/2016, 6:26 PM  [facility] [address] [place], [place], [address] Phone: [phone]   Fax:  [phone]  Name: [name] [name] MRN: [id number] Date of Birth: [birth year]

## 2016-01-31 ENCOUNTER — Ambulatory Visit: Payer: No Typology Code available for payment source | Admitting: Occupational Therapy

## 2016-02-01 ENCOUNTER — Encounter: Payer: Self-pay | Admitting: Occupational Therapy

## 2016-02-08 ENCOUNTER — Ambulatory Visit: Payer: No Typology Code available for payment source | Admitting: Occupational Therapy

## 2016-02-08 DIAGNOSIS — M25641 Stiffness of right hand, not elsewhere classified: Secondary | ICD-10-CM

## 2016-02-08 DIAGNOSIS — M6281 Muscle weakness (generalized): Secondary | ICD-10-CM

## 2016-02-08 DIAGNOSIS — M79641 Pain in right hand: Secondary | ICD-10-CM

## 2016-02-08 DIAGNOSIS — R600 Localized edema: Secondary | ICD-10-CM

## 2016-02-08 NOTE — Therapy (Signed)
[facility] [address] [place], [place], [address] Phone: [phone]   Fax:  [phone]  Occupational Therapy Treatment  Patient Details  Name: [name] [name] MRN: [id number] Date of Birth: 05-12-1985 Referring Provider: Dr. Shanda BumpsSeth Jaffe  Encounter Date: 02/08/2016      OT End of Session - 02/08/16 1455    Visit Number 5   Number of Visits 17   Date for OT Re-Evaluation 03/14/15   Authorization Type self pay/medpay   OT Start Time 1450   OT Stop Time 1530   OT Time Calculation (min) 40 min   Activity Tolerance Patient tolerated treatment well   Behavior During Therapy [facility] for tasks assessed/performed      No past medical history on file.  Past Surgical History:  Procedure Laterality Date  . ARTHROSCOPIC REPAIR ACL    . MEDIAL COLLATERAL LIGAMENT AND LATERAL COLLATERAL LIGAMENT REPAIR, KNEE      There were no vitals filed for this visit.          Exercises   Exercises Hand     Hand Exercises   Other Hand Exercises AROM right thumb for MP and IP flexion, opposition,abduction, Tendon gliding exercises, A/ROM  flexion.  Pt was instructed in upgraded red putty HEP for composite grip, 3 pt and tip pinch. Pt returned demonstration. Pt was instructed only to perform 1-2x day for approx 20 reps each.    Graded clothespins for 3 pt pinch, for yellow-blue         Ultrasound;Fluidotherapy     Ultrasound   Ultrasound Location Right dorsal thumb and IF's    Ultrasound Parameters pulsed US 3 Mhz @ 0.5 w/cm2 x 7 min    Ultrasound Goals Edema;Pain;Other (Comment)  Increase circulation     RUE Fluidotherapy   Number Minutes Fluidotherapy 10 Minutes   RUE Fluidotherapy Location Hand;Wrist   Comments Pt performing AROM ex's while in fluidotherapy (tendon gliding, opposition and wrist AROM) to assist with increased flexibility and ROM, decreased pain./stiffness.      Grip strength: RUE 38  lbs                       OT Short Term Goals - 01/27/16 1825      OT SHORT TERM GOAL #1   Title I with inital HEP.   Time 4   Period Weeks   Status Achieved     OT SHORT TERM GOAL #2   Title Pt will verbalize understanding of pain reduction and edema control techniques.   Time 4   Period Weeks   Status Achieved     OT SHORT TERM GOAL #3   Title Pt will demonstrate right index finger MP flexion 65*, PIP 80, and DIP 25 in prep for improved functional use.   Baseline MP 40, PIP 70, DIP 10   Time 4   Period Weeks   Status On-going     OT SHORT TERM GOAL #4   Title Pt will use RUE for light activities/ ADLs at least 50% of the time with pain lesss than or equal to 4/10.   Time 4   Period Weeks   Status On-going           OT Long Term Goals - 01/14/16 1314      OT LONG TERM GOAL #1   Title I with updated HEP.   Time 8   Period Weeks   Status New  OT LONG TERM GOAL #2   Title Pt will demonstrate full composite finger and thumb flexion WFLS for RUE with pain no greater than 4/10.   Time 8   Period Weeks   Status New     OT LONG TERM GOAL #3   Title Pt will demonstrate RUE grip strength of at least 40 lbs for RUE functional use.   Time 8   Period Weeks   Status New     OT LONG TERM GOAL #4   Title Pt will resume use of RUE as dominant hand at least 90% of the time for ADLs/ IADLs.   Time 8   Period Weeks   Status New     OT LONG TERM GOAL #5   Title Pt will perform simulated work activities safely and independently using RUE.   Time 8   Period Weeks   Status New               Plan - 02/08/16 1456    Clinical Impression Statement Pt is progressing towards goals with decreasing overall pain and swelling   Rehab Potential Good   OT Frequency 2x / week   OT Duration 8 weeks   OT Treatment/Interventions Self-care/ADL training;Moist Heat;Fluidtherapy;DME and/or AE instruction;Splinting;Patient/family education;Therapeutic  exercises;Contrast Bath;Ultrasound;Therapeutic exercise;Therapeutic activities;Passive range of motion;Neuromuscular education;Cryotherapy;Iontophoresis;Parrafin;Manual Therapy   Plan modalities, ROM gentle strengthening   OT Home Exercise Plan issued A/ROM to hand and thumb, yellow theraputty   Consulted and Agree with Plan of Care Patient      Patient will benefit from skilled therapeutic intervention in order to improve the following deficits and impairments:  Decreased coordination, Decreased range of motion, Impaired flexibility, Impaired sensation, Increased edema, Decreased endurance, Decreased activity tolerance, Pain, Impaired UE functional use, Decreased knowledge of use of DME, Decreased strength, Impaired perceived functional ability  Visit Diagnosis: Stiffness of right hand, not elsewhere classified  Pain in right hand  Localized edema  Muscle weakness (generalized)    Problem List There are no active problems to display for this patient.   RINE,KATHRYN 02/08/2016, 2:58 PM   [facility] [address] [place], [place], [address] Phone: [phone]   Fax:  [phone]  Name: [name] [name] MRN: [id number] Date of Birth: 04-20-1985

## 2016-02-17 ENCOUNTER — Ambulatory Visit: Payer: No Typology Code available for payment source | Attending: Orthopedic Surgery | Admitting: Occupational Therapy

## 2016-02-17 DIAGNOSIS — M25641 Stiffness of right hand, not elsewhere classified: Secondary | ICD-10-CM

## 2016-02-17 DIAGNOSIS — R600 Localized edema: Secondary | ICD-10-CM | POA: Diagnosis present

## 2016-02-17 DIAGNOSIS — M79641 Pain in right hand: Secondary | ICD-10-CM | POA: Diagnosis present

## 2016-02-17 DIAGNOSIS — M6281 Muscle weakness (generalized): Secondary | ICD-10-CM | POA: Diagnosis present

## 2016-02-17 NOTE — Therapy (Signed)
[facility] [address] [place], [place], [address] Phone: [phone]   Fax:  [phone]  Occupational Therapy Treatment  Patient Details  Name: [name] [name] MRN: [id number] Date of Birth: 12/16/1985 Referring Provider: Dr. Seth Jaffe  Encounter Date: 02/17/2016      OT End of Session - 02/17/16 1134    Visit Number 6   Number of Visits 17   Date for OT Re-Evaluation 03/14/15   Authorization Type self pay/medpay   OT Start Time 1110   OT Stop Time 1150   OT Time Calculation (min) 40 min   Activity Tolerance Patient tolerated treatment well   Behavior During Therapy WFL for tasks assessed/performed      No past medical history on file.  Past Surgical History:  Procedure Laterality Date  . ARTHROSCOPIC REPAIR ACL    . MEDIAL COLLATERAL LIGAMENT AND LATERAL COLLATERAL LIGAMENT REPAIR, KNEE      There were no vitals filed for this visit.      Subjective Assessment - 02/17/16 1118    Subjective  Pt reports hand is improving    Patient Stated Goals regain use of right hand to return to work   Currently in Pain? Yes   Pain Score 4    Pain Location Hand   Pain Orientation Right   Pain Descriptors / Indicators Aching   Pain Type Acute pain   Pain Onset More than a month ago   Pain Frequency Constant   Aggravating Factors  movement   Pain Relieving Factors fluidotherapy   Multiple Pain Sites No           Treatment:   Exercises   Exercises Hand     Hand Exercises   Other Hand Exercises  P/ROM MP flexion, A/ROM MP and PIP flexion, A/ROM  flexion.red putty for composite grip, yellow , 3 pt and tip pinch. Pt returned demonstration.     Gripper set at #2 to pick up 1 inch blocks for sustained grip,                   Therapist checked goals and checked ROM. Note sent to MD via pt.             RUE Fluidotherapy   Number Minutes Fluidotherapy 10 Minutes   RUE Fluidotherapy Location  Hand;Wrist   Comments Pt performing AROM ex's while in fluidotherapy (tendon gliding, opposition and wrist AROM) to assist with increased flexibility and ROM, decreased pain./stiffness.                                OT Short Term Goals - 02/17/16 1121      OT SHORT TERM GOAL #1   Title I with inital HEP.   Time 4   Period Weeks   Status Achieved     OT SHORT TERM GOAL #2   Title Pt will verbalize understanding of pain reduction and edema control techniques.   Time 4   Period Weeks   Status Achieved     OT SHORT TERM GOAL #3   Title Pt will demonstrate right index finger MP flexion 65*, PIP 80, and DIP 25 in prep for improved functional use.   Baseline MP 40, PIP 70, DIP 10   Time 4   Period Weeks   Status Achieved  MP 70*-75, PIP 95, DIP 35     OT SHORT TERM GOAL #4     Title Pt will use RUE for light activities/ ADLs at least 50% of the time with pain lesss than or equal to 4/10.   Time 4   Period Weeks   Status Partially Met  pain 4-6/10           OT Long Term Goals - 02/17/16 1128      OT LONG TERM GOAL #1   Title I with updated HEP.   Time 8   Period Weeks   Status Achieved     OT LONG TERM GOAL #2   Title Pt will demonstrate full composite finger and thumb flexion WFLS for RUE with pain no greater than 4/10.   Time 8   Period Weeks   Status On-going     OT LONG TERM GOAL #3   Title Pt will demonstrate RUE grip strength of at least 40 lbs for RUE functional use.   Time 8   Period Weeks   Status On-going  30 lbs     OT LONG TERM GOAL #4   Title Pt will resume use of RUE as dominant hand at least 90% of the time for ADLs/ IADLs.   Time 8   Period Weeks   Status --  uses 50% for light activities               Plan - 02/17/16 1120    Clinical Impression Statement Pt is progressing towards goals with decreased overall pain and reduced swelling   Rehab Potential Good   OT Frequency 2x / week   OT Duration 8 weeks    OT Treatment/Interventions Self-care/ADL training;Moist Heat;Fluidtherapy;DME and/or AE instruction;Splinting;Patient/family education;Therapeutic exercises;Contrast Bath;Ultrasound;Therapeutic exercise;Therapeutic activities;Passive range of motion;Neuromuscular education;Cryotherapy;Iontophoresis;Parrafin;Manual Therapy   Plan modalities, ROM, gentle strengthening   OT Home Exercise Plan issued A/ROM to hand and thumb, yellow theraputty   Consulted and Agree with Plan of Care Patient      Patient will benefit from skilled therapeutic intervention in order to improve the following deficits and impairments:  Decreased coordination, Decreased range of motion, Impaired flexibility, Impaired sensation, Increased edema, Decreased endurance, Decreased activity tolerance, Pain, Impaired UE functional use, Decreased knowledge of use of DME, Decreased strength, Impaired perceived functional ability  Visit Diagnosis: Stiffness of right hand, not elsewhere classified  Pain in right hand  Localized edema  Muscle weakness (generalized)    Problem List There are no active problems to display for this patient.   RINE,KATHRYN 02/17/2016, 12:16 PM  [facility] [address] [place], [place], [address] Phone: [phone]   Fax:  [phone]  Name: [name] [name] MRN: [id number] Date of Birth: 04/06/1985  

## 2016-02-17 NOTE — Patient Instructions (Addendum)
Dr Everlena CooperJaffe,   [name] [name] has been seen for 6 visits by occupational therapy. He is performing A/ROM, P/ROM and gentle strengthening. We have used fluidotherapy and ultrasound, and his overall pain and swelling has improved. His A/ROM for right index finger:MP 70-75, PIP 95, DIP 35. Grip strength in RUE: 30 lbs. Pt reports he uses his RUE 50% of the time for light activities with pain 4-6/10. 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 Please advise regarding any new recommendations or precautions. Is he cleared to lift items to his pain tolerance? He can benefit from continued skilled OT in order to address skills needed to return to work. Please feel free to contact me with any questions?  Sincerely,  Samara DeistKathryn Silena Wyss OTR/L

## 2016-02-23 ENCOUNTER — Ambulatory Visit: Payer: No Typology Code available for payment source | Admitting: Occupational Therapy

## 2016-02-23 DIAGNOSIS — M25641 Stiffness of right hand, not elsewhere classified: Secondary | ICD-10-CM | POA: Diagnosis not present

## 2016-02-23 DIAGNOSIS — R600 Localized edema: Secondary | ICD-10-CM

## 2016-02-23 DIAGNOSIS — M79641 Pain in right hand: Secondary | ICD-10-CM

## 2016-02-23 NOTE — Therapy (Signed)
[facility] [address] [place], [place], [address] Phone: [phone]   Fax:  [phone]  Occupational Therapy Treatment  Patient Details  Name: [name] [name] MRN: [id number] Date of Birth: April 17, 1985 Referring Provider: Dr. Sylvester Harder  Encounter Date: 02/23/2016      OT End of Session - 02/23/16 1327    Visit Number 7   Number of Visits 17   Date for OT Re-Evaluation 03/14/15   Authorization Type self pay/medpay   OT Start Time 1025  Pt arrived 10 min. late   OT Stop Time 1055  left 5 min. early d/t another appt at 11:00   OT Time Calculation (min) 30 min   Activity Tolerance Patient tolerated treatment well      No past medical history on file.  Past Surgical History:  Procedure Laterality Date  . ARTHROSCOPIC REPAIR ACL    . MEDIAL COLLATERAL LIGAMENT AND LATERAL COLLATERAL LIGAMENT REPAIR, KNEE      There were no vitals filed for this visit.      Subjective Assessment - 02/23/16 1027    Subjective  It's better. It's still swollen   Pertinent History see epic   Patient Stated Goals regain use of right hand to return to work   Currently in Pain? Yes   Pain Score 5    Pain Location --  INDEX finger   Pain Orientation Right   Pain Descriptors / Indicators Aching   Pain Type Acute pain   Pain Onset More than a month ago   Pain Frequency Constant   Aggravating Factors  movement    Pain Relieving Factors fluidotherapy                      OT Treatments/Exercises (OP) - 02/23/16 0001      Hand Exercises   Other Hand Exercises Pt shown pen rolling ex and pt return demo for full composite flexion with end range MP stretch     Ultrasound   Ultrasound Location Rt volar base of index finger at MP joint level   Ultrasound Parameters 3 Mhz, 20% pulsed, 0.8 wts/cm2 x 8 minutes   Ultrasound Goals Edema;Pain     Manual Therapy   Manual Therapy Passive ROM   Manual therapy  comments soft tissue mobs around MP joint, and joint mobs between metacarpals index and long finger   Passive ROM Passive ROM to index MP joint isolated, then compositely with PIP flexion, followed by place and hold ex's.                   OT Short Term Goals - 02/17/16 1121      OT SHORT TERM GOAL #1   Title I with inital HEP.   Time 4   Period Weeks   Status Achieved     OT SHORT TERM GOAL #2   Title Pt will verbalize understanding of pain reduction and edema control techniques.   Time 4   Period Weeks   Status Achieved     OT SHORT TERM GOAL #3   Title Pt will demonstrate right index finger MP flexion 65*, PIP 80, and DIP 25 in prep for improved functional use.   Baseline MP 40, PIP 70, DIP 10   Time 4   Period Weeks   Status Achieved  MP 70*-75, PIP 95, DIP 35     OT SHORT TERM GOAL #4   Title Pt will use RUE for light  activities/ ADLs at least 50% of the time with pain lesss than or equal to 4/10.   Time 4   Period Weeks   Status Partially Met  pain 4-6/10           OT Long Term Goals - 02/17/16 1128      OT LONG TERM GOAL #1   Title I with updated HEP.   Time 8   Period Weeks   Status Achieved     OT LONG TERM GOAL #2   Title Pt will demonstrate full composite finger and thumb flexion WFLS for RUE with pain no greater than 4/10.   Time 8   Period Weeks   Status On-going     OT LONG TERM GOAL #3   Title Pt will demonstrate RUE grip strength of at least 40 lbs for RUE functional use.   Time 8   Period Weeks   Status On-going  30 lbs     OT LONG TERM GOAL #4   Title Pt will resume use of RUE as dominant hand at least 90% of the time for ADLs/ IADLs.   Time 8   Period Weeks   Status --  uses 50% for light activities               Plan - 02/23/16 1328    Clinical Impression Statement Pt making progress with MP flexion during composite flexion, but remain tight at end range. Pt still has moderate MP joint swelling   Rehab  Potential Good   OT Frequency 2x / week   OT Duration 8 weeks   OT Treatment/Interventions Self-care/ADL training;Moist Heat;Fluidtherapy;DME and/or AE instruction;Splinting;Patient/family education;Therapeutic exercises;Contrast Bath;Ultrasound;Therapeutic exercise;Therapeutic activities;Passive range of motion;Neuromuscular education;Cryotherapy;Iontophoresis;Parrafin;Manual Therapy   Plan continue modalities, ROM, gentle strengthening   OT Home Exercise Plan issued A/ROM to hand and thumb, yellow theraputty   Consulted and Agree with Plan of Care Patient      Patient will benefit from skilled therapeutic intervention in order to improve the following deficits and impairments:  Decreased coordination, Decreased range of motion, Impaired flexibility, Impaired sensation, Increased edema, Decreased endurance, Decreased activity tolerance, Pain, Impaired UE functional use, Decreased knowledge of use of DME, Decreased strength, Impaired perceived functional ability  Visit Diagnosis: Stiffness of right hand, not elsewhere classified  Pain in right hand  Localized edema    Problem List There are no active problems to display for this patient.   Carey Bullocks, OTR/L 02/23/2016, 1:30 PM  [facility] [address] [place], [place], [address] Phone: [phone]   Fax:  [phone]  Name: [name] [name] MRN: [id number] Date of Birth: [birth year]

## 2016-02-25 ENCOUNTER — Ambulatory Visit: Payer: No Typology Code available for payment source | Admitting: Occupational Therapy

## 2016-02-29 ENCOUNTER — Ambulatory Visit: Payer: No Typology Code available for payment source | Admitting: Occupational Therapy

## 2016-02-29 DIAGNOSIS — M6281 Muscle weakness (generalized): Secondary | ICD-10-CM

## 2016-02-29 DIAGNOSIS — M79641 Pain in right hand: Secondary | ICD-10-CM

## 2016-02-29 DIAGNOSIS — M25641 Stiffness of right hand, not elsewhere classified: Secondary | ICD-10-CM

## 2016-02-29 DIAGNOSIS — R600 Localized edema: Secondary | ICD-10-CM

## 2016-02-29 NOTE — Therapy (Signed)
[facility] [address] [place], [place], [address] Phone: [phone]   Fax:  [phone]  Occupational Therapy Treatment  Patient Details  Name: [name] [name] MRN: [id number] Date of Birth: 01-29-1986 Referring Provider: Dr. Sylvester Harder  Encounter Date: 02/29/2016      OT End of Session - 02/29/16 1215    Visit Number 8   Number of Visits 17   Date for OT Re-Evaluation 03/14/15   Authorization Type self pay/medpay   OT Start Time 1157  pt late   OT Stop Time 1230   OT Time Calculation (min) 33 min   Activity Tolerance Patient tolerated treatment well   Behavior During Therapy [facility] for tasks assessed/performed      No past medical history on file.  Past Surgical History:  Procedure Laterality Date  . ARTHROSCOPIC REPAIR ACL    . MEDIAL COLLATERAL LIGAMENT AND LATERAL COLLATERAL LIGAMENT REPAIR, KNEE      There were no vitals filed for this visit.      Subjective Assessment - 02/29/16 1214    Subjective  It's better. Pt reports he plans to go back to [place] on Jan 5th   Pertinent History see epic   Patient Stated Goals regain use of right hand to return to work   Currently in Pain? Yes   Pain Score 5    Pain Location Finger (Comment which one)  index   Pain Orientation Right   Pain Descriptors / Indicators Aching   Pain Type Acute pain   Pain Onset More than a month ago   Pain Frequency Constant   Aggravating Factors  movement   Pain Relieving Factors heat, fluidotherapy         [place] 89mz, 0.5 w/cm2, 20% x7 mins to dorsal hand surrounding MP joint of right hand, for edema control, pain relief, no adverse reactions. A/ROM tendon gliding, pt demonstrates improved composite finger flexion today, he reports performing pen rolling exercise Gripper set at level 4 for picking up 1 inch blocks for sustained grip, min difficulty Graded clothespins 1-8 # for sustained tip and 3 pt pinch strength, min  difficulty/ fatigue, 1 rest break required Amb while carrying 25 then 40 lbs crate, followed by liting from floor to table and then overhead shelf for simulated work tasks, no significant increase in pain. Pt held a 10 lbs weight in right hand and lifted to overhead shelf x 10 without difficulty.                        OT Short Term Goals - 02/17/16 1121      OT SHORT TERM GOAL #1   Title I with inital HEP.   Time 4   Period Weeks   Status Achieved     OT SHORT TERM GOAL #2   Title Pt will verbalize understanding of pain reduction and edema control techniques.   Time 4   Period Weeks   Status Achieved     OT SHORT TERM GOAL #3   Title Pt will demonstrate right index finger MP flexion 65*, PIP 80, and DIP 25 in prep for improved functional use.   Baseline MP 40, PIP 70, DIP 10   Time 4   Period Weeks   Status Achieved  MP 70*-75, PIP 95, DIP 35     OT SHORT TERM GOAL #4   Title Pt will use RUE for light activities/ ADLs at least 50% of the  time with pain lesss than or equal to 4/10.   Time 4   Period Weeks   Status Partially Met  pain 4-6/10           OT Long Term Goals - 02/17/16 1128      OT LONG TERM GOAL #1   Title I with updated HEP.   Time 8   Period Weeks   Status Achieved     OT LONG TERM GOAL #2   Title Pt will demonstrate full composite finger and thumb flexion WFLS for RUE with pain no greater than 4/10.   Time 8   Period Weeks   Status On-going     OT LONG TERM GOAL #3   Title Pt will demonstrate RUE grip strength of at least 40 lbs for RUE functional use.   Time 8   Period Weeks   Status On-going  30 lbs     OT LONG TERM GOAL #4   Title Pt will resume use of RUE as dominant hand at least 90% of the time for ADLs/ IADLs.   Time 8   Period Weeks   Status --  uses 50% for light activities             Patient will benefit from skilled therapeutic intervention in order to improve the following deficits and  impairments:     Visit Diagnosis: Stiffness of right hand, not elsewhere classified  Pain in right hand  Localized edema  Muscle weakness (generalized)    Problem List There are no active problems to display for this patient.   Aylana Hirschfeld 02/29/2016, 12:26 PM  [facility] [address] [place], [place], [address] Phone: [phone]   Fax:  [phone]  Name: [name] [name] MRN: [id number] Date of Birth: 04-04-1985

## 2016-03-02 ENCOUNTER — Encounter: Payer: Self-pay | Admitting: Occupational Therapy

## 2016-03-08 ENCOUNTER — Ambulatory Visit: Payer: No Typology Code available for payment source | Admitting: Occupational Therapy

## 2016-03-14 ENCOUNTER — Ambulatory Visit: Payer: No Typology Code available for payment source | Attending: Orthopedic Surgery | Admitting: Occupational Therapy

## 2016-03-16 ENCOUNTER — Ambulatory Visit: Payer: No Typology Code available for payment source | Admitting: Occupational Therapy

## 2016-09-15 ENCOUNTER — Encounter: Payer: Self-pay | Admitting: Occupational Therapy

## 2016-09-15 NOTE — Therapy (Signed)
[facility] [address] [place], [place], [address] Phone: [phone]   Fax:  [phone]  Patient Details  Name: [name] [name] MRN: [id number] Date of Birth: 02/05/1986 Referring Provider:  No ref. provider found  Encounter Date: 09/15/2016     OT Short Term Goals - 02/17/16 1121      OT SHORT TERM GOAL #1   Title I with inital HEP.   Time 4   Period Weeks   Status Achieved     OT SHORT TERM GOAL #2   Title Pt will verbalize understanding of pain reduction and edema control techniques.   Time 4   Period Weeks   Status Achieved     OT SHORT TERM GOAL #3   Title Pt will demonstrate right index finger MP flexion 65*, PIP 80, and DIP 25 in prep for improved functional use.   Baseline MP 40, PIP 70, DIP 10   Time 4   Period Weeks   Status Achieved  MP 70*-75, PIP 95, DIP 35     OT SHORT TERM GOAL #4   Title Pt will use RUE for light activities/ ADLs at least 50% of the time with pain lesss than or equal to 4/10.   Time 4   Period Weeks   Status Partially Met  pain 4-6/10         OT Long Term Goals - 02/17/16 1128      OT LONG TERM GOAL #1   Title I with updated HEP.   Time 8   Period Weeks   Status Achieved     OT LONG TERM GOAL #2   Title Pt will demonstrate full composite finger and thumb flexion WFLS for RUE with pain no greater than 4/10.   Time 8   Period Weeks   Status Not assessed     OT LONG TERM GOAL #3   Title Pt will demonstrate RUE grip strength of at least 40 lbs for RUE functional use.   Time 8   Period Weeks   Status Not assessed 30 lbs     OT LONG TERM GOAL #4   Title Pt will resume use of RUE as dominant hand at least 90% of the time for ADLs/ IADLs.   Time 8   Period Weeks   Status Not assessed uses 50% for light activities    OCCUPATIONAL THERAPY DISCHARGE SUMMARY  Visits from Start of Care:8  Current functional level related to goals / functional  outcomes: See above, goals were not fully assessed as pt did not return to therapy.   Remaining deficits:  unable to assess as pt did not return   Education / Equipment: Pt was educated in HEP and pain reduction strategies. Pt goals were not fully assessed as pt did not return Plan: Patient agrees to discharge.  Patient goals were not met. Patient is being discharged due to the patient's request.  ?????      Jeshurun Oaxaca 09/15/2016, 5:14 PM  [facility] [address] [place], [place], [address] Phone: [phone]   Fax:  [phone]

## 2017-02-11 ENCOUNTER — Encounter (HOSPITAL_COMMUNITY): Payer: Self-pay

## 2017-02-11 ENCOUNTER — Emergency Department (HOSPITAL_COMMUNITY)
Admission: EM | Admit: 2017-02-11 | Discharge: 2017-02-11 | Disposition: A | Discharge status: Home / Self Care | Payer: Self-pay | Attending: Emergency Medicine | Admitting: Emergency Medicine

## 2017-02-11 DIAGNOSIS — Y999 Unspecified external cause status: Secondary | ICD-10-CM | POA: Insufficient documentation

## 2017-02-11 DIAGNOSIS — Y939 Activity, unspecified: Secondary | ICD-10-CM | POA: Insufficient documentation

## 2017-02-11 DIAGNOSIS — Y92009 Unspecified place in unspecified non-institutional (private) residence as the place of occurrence of the external cause: Secondary | ICD-10-CM | POA: Insufficient documentation

## 2017-02-11 DIAGNOSIS — Z87891 Personal history of nicotine dependence: Secondary | ICD-10-CM | POA: Insufficient documentation

## 2017-02-11 DIAGNOSIS — S0501XA Injury of conjunctiva and corneal abrasion without foreign body, right eye, initial encounter: Secondary | ICD-10-CM | POA: Insufficient documentation

## 2017-02-11 DIAGNOSIS — X509XXA Other and unspecified overexertion or strenuous movements or postures, initial encounter: Secondary | ICD-10-CM | POA: Insufficient documentation

## 2017-02-11 MED ORDER — FLUORESCEIN SODIUM 1 MG OP STRP
1.0000 | ORAL_STRIP | Freq: Once | OPHTHALMIC | Status: AC
  Administered 2017-02-11: 1 via OPHTHALMIC

## 2017-02-11 MED ORDER — FLUORESCEIN SODIUM 1 MG OP STRP
ORAL_STRIP | OPHTHALMIC | Status: AC
  Filled 2017-02-11: qty 1

## 2017-02-11 MED ORDER — IBUPROFEN 800 MG PO TABS
800.0000 mg | ORAL_TABLET | Freq: Once | ORAL | Status: AC
  Administered 2017-02-11: 800 mg via ORAL
  Filled 2017-02-11: qty 1

## 2017-02-11 MED ORDER — ERYTHROMYCIN 5 MG/GM OP OINT: TOPICAL_OINTMENT | OPHTHALMIC | 0 refills | Status: AC

## 2017-02-11 MED ORDER — TETRACAINE HCL 0.5 % OP SOLN
1.0000 [drp] | Freq: Once | OPHTHALMIC | Status: AC
  Administered 2017-02-11: 1 [drp] via OPHTHALMIC

## 2017-02-11 NOTE — ED Provider Notes (Signed)
[facility]ENC[facility] Provider Note   CSN: [id number] Arrival date & time: 02/11/17  1543     History   Chief Complaint No chief complaint on file.   HPI [name] [name] is a 31 y.o. male resents with complaint of right eye symptoms that started this morning when he woke up.  Symptoms include right eye discomfort, photophobia, watery drainage, and redness.  States that the discomfort that he is experiencing is worse with light and with certain eye movements specifically looking down.  Has not taken anything for his symptoms today.  No Alleviating factors.  Parenting some mild rhinorrhea.  Patient does not recall injury to the eye or getting anything in the eye.  Denies change in vision, blurred vision, double vision, headache, fever, chills, ear pain, or sore throat.  He does not wear contacts or glasses. HPI  History reviewed. No pertinent past medical history.  There are no active problems to display for this patient.   Past Surgical History:  Procedure Laterality Date  . ARTHROSCOPIC REPAIR ACL    . MEDIAL COLLATERAL LIGAMENT AND LATERAL COLLATERAL LIGAMENT REPAIR, KNEE        Home Medications    Prior to Admission medications   Medication Sig Start Date End Date Taking? Authorizing Provider  erythromycin ophthalmic ointment Place a 1/2 inch ribbon of ointment into the lower eyelid. Patient not taking: Reported on 01/14/2016 09/08/13   Muthersbaugh, Dahlia ClientHannah, PA-C  HYDROcodone-acetaminophen (NORCO/VICODIN) 5-325 MG per tablet Take 1-2 tablets by mouth every 4 (four) hours as needed for moderate pain or severe pain. Patient not taking: Reported on 01/14/2016 03/11/14   Lurene ShadowPhelps, Erin O, PA-C  ibuprofen (ADVIL,MOTRIN) 800 MG tablet Take 1 tablet (800 mg total) by mouth every 8 (eight) hours as needed for mild pain or moderate pain. Patient not taking: Reported on 01/14/2016 10/19/15   Fayrene Helperran, Bowie, PA-C  methocarbamol (ROBAXIN) 500 MG tablet Take 1  tablet (500 mg total) by mouth every 6 (six) hours as needed for muscle spasms (and pain). Patient not taking: Reported on 01/14/2016 10/19/15   Fayrene Helperran, Bowie, PA-C  oxyCODONE-acetaminophen (PERCOCET/ROXICET) 5-325 MG per tablet Take 1-2 tablets by mouth every 6 (six) hours as needed for moderate pain or severe pain. Patient not taking: Reported on 01/14/2016 03/18/14   Rolla PlatePhelps, Erin O, PA-C    Family History History reviewed. No pertinent family history.  Social History Social History   Tobacco Use  . Smoking status: Former Smoker    Types: Cigarettes  . Smokeless tobacco: Never Used  Substance Use Topics  . Alcohol use: Yes    Comment: occ  . Drug use: No     Allergies   Patient has no known allergies.   Review of Systems Review of Systems  Constitutional: Negative for chills and fever.  HENT: Positive for rhinorrhea. Negative for ear discharge, ear pain and sore throat.   Eyes: Positive for photophobia, pain, discharge (watery ) and redness. Negative for itching and visual disturbance.  Respiratory: Negative for cough.   Neurological: Negative for dizziness and headaches.     Physical Exam Updated Vital Signs BP 130/88   Pulse 80   Temp 98.3 F (36.8 C) (Oral)   Resp 16   SpO2 99%   Physical Exam  Constitutional: He appears well-developed and well-nourished. No distress.  HENT:  Head: Normocephalic and atraumatic.  Eyes: EOM and lids are normal. Pupils are equal, round, and reactive to light. Lids are everted and swept, no  foreign bodies found. Right eye exhibits no discharge. Left eye exhibits no discharge. Right conjunctiva is injected (mildly).  Woods lamp exam: Fluorescein uptake consistent with corneal abrasion at the 7:00 position of the R eye.  Visual acuity: L eye 20/20, R eye 20/20, bilateral 20/20  Neurological: He is alert.  Clear speech.   Psychiatric: He has a normal mood and affect. His behavior is normal. Thought content normal.  Nursing note and  vitals reviewed.    ED Treatments / Results  Labs (all labs ordered are listed, but only abnormal results are displayed) Labs Reviewed - No data to display  EKG  EKG Interpretation None       Radiology No results found.  Procedures Procedures (including critical care time)  Medications Ordered in ED Medications  fluorescein ophthalmic strip 1 strip (not administered)  tetracaine (PONTOCAINE) 0.5 % ophthalmic solution 1 drop (not administered)   Initial Impression / Assessment and Plan / ED Course  I have reviewed the triage vital signs and the nursing notes.  Pertinent labs & imaging results that were available during my care of the patient were reviewed by me and considered in my medical decision making (see chart for details).   Patient presents with eye pain, watering, and redness. Woods lamp exam consistent with corneal abrasion at the 7:00 position. No evidence of foreign body. No change in vision, acuity equal bilaterally.  Patient is not a contact lens wearer.  Exam non-concerning for orbital cellulitis or corneal ulcers. Patient will be discharged home with erythromycin.   Discussed diagnosis, treatment plan, follow up and return precautions (including change in vision, purulent drainage, or fever) with patient.  Provided opportunity for questions, patient confirmed understanding and is in agreement with plan.     Final Clinical Impressions(s) / ED Diagnoses   Final diagnoses:  Abrasion of right cornea, initial encounter    ED Discharge Orders        Ordered    erythromycin ophthalmic ointment     02/11/17 2029       Ariabella Brien, Pleas KochSamantha R, PA-C 02/11/17 2057  For further clarification corneal abrasion approximately 2-3 mm in size.    Desmond Lopeetrucelli, Sherelle Castelli R, PA-C 02/11/17 2058    Maia PlanLong, Joshua G, MD 02/12/17 1002

## 2017-02-11 NOTE — ED Triage Notes (Signed)
Patient here with 1 day of right eye redness and drainage, positive photophobia

## 2017-02-11 NOTE — Discharge Instructions (Signed)
You were seen in the emergency department and diagnosed with a corneal abrasion- this is a cut to the upper layer of your eye. I have prescribed you  an antibiotic ointment to ensure that your eye does not get infected. You will need to apply the antibiotic ointment to your right eye 4 times per day for 7 days.  You may take ibuprofen or Tylenol for discomfort.   Follow up with your primary care provider in 1 week for reevaluation.  If you do not have a primary care provider our clinic information is provided in your discharge instructions. Return to the emergency department for any new or worsening symptoms including but not limited to change in your vision, double vision, blurred vision, fever, or pus like discharge from the eye.

## 2017-02-11 NOTE — ED Notes (Signed)
See providers notes

## 2017-04-22 ENCOUNTER — Other Ambulatory Visit: Payer: Self-pay

## 2017-04-22 ENCOUNTER — Encounter (HOSPITAL_COMMUNITY): Payer: Self-pay

## 2017-04-22 ENCOUNTER — Emergency Department (HOSPITAL_COMMUNITY)
Admission: EM | Admit: 2017-04-22 | Discharge: 2017-04-22 | Disposition: A | Discharge status: Home / Self Care | Payer: Self-pay | Attending: Emergency Medicine | Admitting: Emergency Medicine

## 2017-04-22 ENCOUNTER — Emergency Department (HOSPITAL_COMMUNITY): Payer: Self-pay

## 2017-04-22 DIAGNOSIS — J111 Influenza due to unidentified influenza virus with other respiratory manifestations: Secondary | ICD-10-CM | POA: Insufficient documentation

## 2017-04-22 DIAGNOSIS — S6992XA Unspecified injury of left wrist, hand and finger(s), initial encounter: Secondary | ICD-10-CM | POA: Insufficient documentation

## 2017-04-22 DIAGNOSIS — R69 Illness, unspecified: Secondary | ICD-10-CM

## 2017-04-22 DIAGNOSIS — Y998 Other external cause status: Secondary | ICD-10-CM | POA: Insufficient documentation

## 2017-04-22 DIAGNOSIS — Y929 Unspecified place or not applicable: Secondary | ICD-10-CM | POA: Insufficient documentation

## 2017-04-22 DIAGNOSIS — Y9389 Activity, other specified: Secondary | ICD-10-CM | POA: Insufficient documentation

## 2017-04-22 DIAGNOSIS — X58XXXA Exposure to other specified factors, initial encounter: Secondary | ICD-10-CM | POA: Insufficient documentation

## 2017-04-22 DIAGNOSIS — Z87891 Personal history of nicotine dependence: Secondary | ICD-10-CM | POA: Insufficient documentation

## 2017-04-22 LAB — INFLUENZA PANEL BY PCR (TYPE A & B)
INFLAPCR: NEGATIVE
INFLBPCR: NEGATIVE

## 2017-04-22 MED ORDER — IBUPROFEN 600 MG PO TABS: 600.0000 mg | ORAL_TABLET | Freq: Four times a day (QID) | ORAL | 0 refills | Status: AC | PRN

## 2017-04-22 MED ORDER — ACETAMINOPHEN 325 MG PO TABS
650.0000 mg | ORAL_TABLET | Freq: Once | ORAL | Status: AC | PRN
  Administered 2017-04-22: 650 mg via ORAL
  Filled 2017-04-22: qty 2

## 2017-04-22 MED ORDER — IBUPROFEN 400 MG PO TABS
600.0000 mg | ORAL_TABLET | Freq: Once | ORAL | Status: AC
  Administered 2017-04-22: 600 mg via ORAL
  Filled 2017-04-22: qty 1

## 2017-04-22 MED ORDER — BENZONATATE 100 MG PO CAPS: 100.0000 mg | ORAL_CAPSULE | Freq: Three times a day (TID) | ORAL | 0 refills | Status: AC

## 2017-04-22 NOTE — ED Notes (Signed)
Mask placed on pt due to symptoms.

## 2017-04-22 NOTE — ED Triage Notes (Signed)
Patient complains of fever with body aches x 2 days. States that he took tylenol last night, no cough, no congestion

## 2017-04-22 NOTE — ED Provider Notes (Signed)
[facility] EMERGENCY DEPARTMENT Provider Note   CSN: [id number] Arrival date & time: 04/22/17  4098     History   Chief Complaint Chief Complaint  Patient presents with  . Wrist Pain  . Cough  . Fever    HPI Tayveon Lombardo Garms is a 32 y.o. male.  HPI   Loraine Freid Tarbet is a 32 y.o. male, patient with no pertinent past medical history, presenting to the ED with two complaints: First, patient complains of left wrist pain beginning last night.  States he "hurt it somehow" when playing with his knees.  Pain is aching, 10/10, radiating into the hand.  Feels as though his hand is weaker.  Denies numbness or other injuries.  Denies any recent procedures to the area.  Second, the patient complains of nasal congestion and nonproductive cough beginning last night.  Denies shortness of breath, sore throat, neck pain/stiffness, rash, chest pain, abdominal pain, N/V/D, or any other complaints.   History reviewed. No pertinent past medical history.  There are no active problems to display for this patient.   Past Surgical History:  Procedure Laterality Date  . ARTHROSCOPIC REPAIR ACL    . MEDIAL COLLATERAL LIGAMENT AND LATERAL COLLATERAL LIGAMENT REPAIR, KNEE         Home Medications    Prior to Admission medications   Medication Sig Start Date End Date Taking? Authorizing Provider  benzonatate (TESSALON) 100 MG capsule Take 1 capsule (100 mg total) by mouth every 8 (eight) hours. 04/22/17   Syed Zukas C, PA-C  erythromycin ophthalmic ointment Place a 1/2 inch ribbon of ointment into the lower eyelid four times per day for 7 days. 02/11/17   Petrucelli, Samantha R, PA-C  ibuprofen (ADVIL,MOTRIN) 600 MG tablet Take 1 tablet (600 mg total) by mouth every 6 (six) hours as needed. 04/22/17   Anselm Pancoast, PA-C    Family History No family history on file.  Social History Social History   Tobacco Use  . Smoking status: Former Smoker    Types: Cigarettes  .  Smokeless tobacco: Never Used  Substance Use Topics  . Alcohol use: Yes    Comment: occ  . Drug use: No     Allergies   Patient has no known allergies.   Review of Systems Review of Systems  Constitutional: Positive for fever.  HENT: Positive for congestion. Negative for sinus pressure, sore throat, trouble swallowing and voice change.   Respiratory: Positive for cough. Negative for shortness of breath.   Cardiovascular: Negative for chest pain and leg swelling.  Gastrointestinal: Negative for abdominal pain, diarrhea, nausea and vomiting.  Musculoskeletal: Positive for arthralgias. Negative for neck pain and neck stiffness.  Neurological: Positive for weakness. Negative for numbness and headaches.  All other systems reviewed and are negative.    Physical Exam Updated Vital Signs BP 139/77 (BP Location: Right Arm)   Pulse (!) 109   Temp (!) 102.5 F (39.2 C) (Oral)   Resp 18   Ht 6\' 1"  (1.854 m)   Wt 80.7 kg (178 lb)   SpO2 97%   BMI 23.48 kg/m   Physical Exam  Constitutional: He appears well-developed and well-nourished. No distress.  HENT:  Head: Normocephalic and atraumatic.  Nose: Mucosal edema and rhinorrhea present. Right sinus exhibits no maxillary sinus tenderness and no frontal sinus tenderness. Left sinus exhibits no maxillary sinus tenderness and no frontal sinus tenderness.  Mouth/Throat: Oropharynx is clear and moist.  Nasal congestion noted  Eyes:  Conjunctivae are normal.  Neck: Neck supple.  Cardiovascular: Normal rate, regular rhythm, normal heart sounds and intact distal pulses.  Pulmonary/Chest: Effort normal and breath sounds normal. No respiratory distress.  Patient speaks in full sentences without difficulty.  No increased work of breathing noted.  Abdominal: Soft. There is no tenderness. There is no guarding.  Musculoskeletal: He exhibits tenderness. He exhibits no edema or deformity.       Left wrist: He exhibits tenderness.  Patient has  tenderness to the left dorsal wrist, specifically with tenderness over the left anatomical snuffbox.  Range of motion limited, likely due to pain.  No noted edema, erythema, increased warmth, or deformity. Range of motion intact in the fingers of the left hand.  Lymphadenopathy:    He has no cervical adenopathy.  Neurological: He is alert.  No sensory deficits noted. Abduction and adduction of the fingers intact against resistance. Grip strength equal bilaterally. Some decreased strength with the tips of the index finger and thumb connected. Motor function intact in the left wrist, though limited due to pain. Patient can touch the thumb to each one of the fingertips without difficulty.   Skin: Skin is warm and dry. He is not diaphoretic.  Psychiatric: He has a normal mood and affect. His behavior is normal.  Nursing note and vitals reviewed.    ED Treatments / Results  Labs (all labs ordered are listed, but only abnormal results are displayed) Labs Reviewed  INFLUENZA PANEL BY PCR (TYPE A & B)    EKG  EKG Interpretation None       Radiology Dg Wrist Complete Left  Result Date: 04/22/2017 CLINICAL DATA:  Lifting injury.  Wrist pain EXAM: LEFT WRIST - COMPLETE 3+ VIEW COMPARISON:  None. FINDINGS: There is no evidence of fracture or dislocation. There is no evidence of arthropathy or other focal bone abnormality. Soft tissues are unremarkable. IMPRESSION: Negative. Electronically Signed   By: Charlett Nose M.D.   On: 04/22/2017 10:12   Dg Hand Complete Left  Result Date: 04/22/2017 CLINICAL DATA:  Wrist pain.  Lifting injury. EXAM: LEFT HAND - COMPLETE 3+ VIEW COMPARISON:  None. FINDINGS: There is no evidence of fracture or dislocation. There is no evidence of arthropathy or other focal bone abnormality. Soft tissues are unremarkable. IMPRESSION: Negative. Electronically Signed   By: Charlett Nose M.D.   On: 04/22/2017 10:12    Procedures Procedures (including critical care  time)  Medications Ordered in ED Medications  acetaminophen (TYLENOL) tablet 650 mg (650 mg Oral Given 04/22/17 0909)  ibuprofen (ADVIL,MOTRIN) tablet 600 mg (600 mg Oral Given 04/22/17 1033)     Initial Impression / Assessment and Plan / ED Course  I have reviewed the triage vital signs and the nursing notes.  Pertinent labs & imaging results that were available during my care of the patient were reviewed by me and considered in my medical decision making (see chart for details).  Clinical Course as of Apr 23 1347  Sun Apr 22, 2017  1610 Spoke with Dr. Aundria Rud, on call for hand surgery.  Plan for velcro thumb spica splint and office follow up in about a week.   [SJ]    Clinical Course User Index [SJ] Duey Liller C, PA-C    Patient presents with 2 complaints.  Complains of left wrist injury as well as cough and congestion. I suspect patient's fever is connected with his cough and congestion, giving suspicion for possible influenza-like illness.  Patient has tenderness over the  left anatomical snuffbox as well as weakness between the index finger and thumb.  Suspicion for possible scaphoid injury. My suspicion for septic arthritis is low due to the lack of risk factors, younger age, and physical exam not suggestive. Influenza test results pending at discharge. The patient was given instructions for home care as well as return precautions. Patient voices understanding of these instructions, accepts the plan, and is comfortable with discharge.  Vitals:   04/22/17 0903 04/22/17 1113  BP: 139/77 115/79  Pulse: (!) 109 98  Resp: 18 16  Temp: (!) 102.5 F (39.2 C)   TempSrc: Oral   SpO2: 97% 99%  Weight: 80.7 kg (178 lb)   Height: 6\' 1"  (1.854 m)      Final Clinical Impressions(s) / ED Diagnoses   Final diagnoses:  Influenza-like illness  Injury of left wrist, initial encounter    ED Discharge Orders        Ordered    ibuprofen (ADVIL,MOTRIN) 600 MG tablet  Every 6 hours PRN      04/22/17 1102    benzonatate (TESSALON) 100 MG capsule  Every 8 hours     04/22/17 1102       Anselm PancoastJoy, Raeann Offner C, PA-C 04/22/17 1350    Maia PlanLong, Joshua G, MD 04/23/17 [phone]

## 2017-04-22 NOTE — Discharge Instructions (Addendum)
°  You have been seen today for a wrist injury. There were no acute abnormalities on the x-rays, including no sign of fracture or dislocation, however, there could be injuries to the soft tissues, such as the ligaments or tendons that are not seen on xrays. There could also be what are called occult fractures that are small fractures not seen on xray. Pain: Take 600 mg of ibuprofen every 6 hours or 440 mg (over the counter dose) to 500 mg (prescription dose) of naproxen every 12 hours for the next 3 days. After this time, these medications may be used as needed for pain. Take these medications with food to avoid upset stomach. Choose only one of these medications, do not take them together.  Tylenol: Should you continue to have additional pain while taking the ibuprofen or naproxen, you may add in tylenol as needed. Your daily total maximum amount of tylenol from all sources should be limited to 4000mg /day for persons without liver problems, or 2000mg /day for those with liver problems. Ice: May apply ice to the area over the next 24 hours for 15 minutes at a time to reduce swelling. Elevation: Keep the extremity elevated as often as possible to reduce pain and inflammation. Support: Wear the wrist splint at all times when not showering. Wear this until pain resolves.  Avoid using the affected hand. Exercises: Start by performing these exercises a few times a week, increasing the frequency until you are performing them twice daily.  Follow up: Follow-up with hand specialist as soon as possible.  Call the number provided to set up an appointment.  Cough Your symptoms are consistent with a viral illness. Viruses do not require antibiotics. Treatment is symptomatic care and it is important to note that these symptoms may last for 7-14 days.   Hand washing: Wash your hands throughout the day, but especially before and after touching the face, using the restroom, sneezing, coughing, or touching surfaces that  have been coughed or sneezed upon. Hydration: Symptoms will be intensified and complicated by dehydration. Dehydration can also extend the duration of symptoms. Drink plenty of fluids and get plenty of rest. You should be drinking at least half a liter of water an hour to stay hydrated. Electrolyte drinks (ex. Gatorade, Powerade, Pedialyte) are also encouraged. You should be drinking enough fluids to make your urine light yellow, almost clear. If this is not the case, you are not drinking enough water. Please note that some of the treatments indicated below will not be effective if you are not adequately hydrated. Pain or fever: Ibuprofen, Naproxen, or Tylenol for pain or fever.  Tessalon: Use the Tessalon, as needed, for cough. Zyrtec or Claritin: May add these medication daily to control underlying symptoms of congestion, sneezing, and other signs of allergies. Flonase: Use this medication, as directed, for nasal and sinus congestion. Congestion: Plain Mucinex may help relieve congestion. Saline sinus rinses and saline nasal sprays may also help relieve congestion. If you do not have heart problems or an allergy to such medications, you may also try phenylephrine or Sudafed. Sore throat: Warm liquids or Chloraseptic spray may help soothe a sore throat. Gargle twice a day with a salt water solution made from a half teaspoon of salt in a cup of warm water.  Follow up: Follow up with a primary care provider, as needed, for any future management of this issue.

## 2017-04-22 NOTE — Progress Notes (Signed)
Orthopedic Tech Progress Note Patient Details:  [facility] 1985-04-29 [id number]  Ortho Devices Type of Ortho Device: Thumb velcro splint Ortho Device/Splint Interventions: Application   Post Interventions Patient Tolerated: Well Instructions Provided: Care of device   Saul FordyceJennifer C Markie Frith 04/22/2017, 10:34 AM

## 2018-06-28 IMAGING — DX DG HAND COMPLETE 3+V*R*
3 series · 3 of 3 positions shown · non-contrast
Comparison: Plain films right hand 0 8/0 this [DATE].

CLINICAL DATA: Status post fall last night with pain about the
second and third MCP joints of the right hand. Initial encounter.

EXAM:
RIGHT HAND - COMPLETE 3+ VIEW

[hand pa]
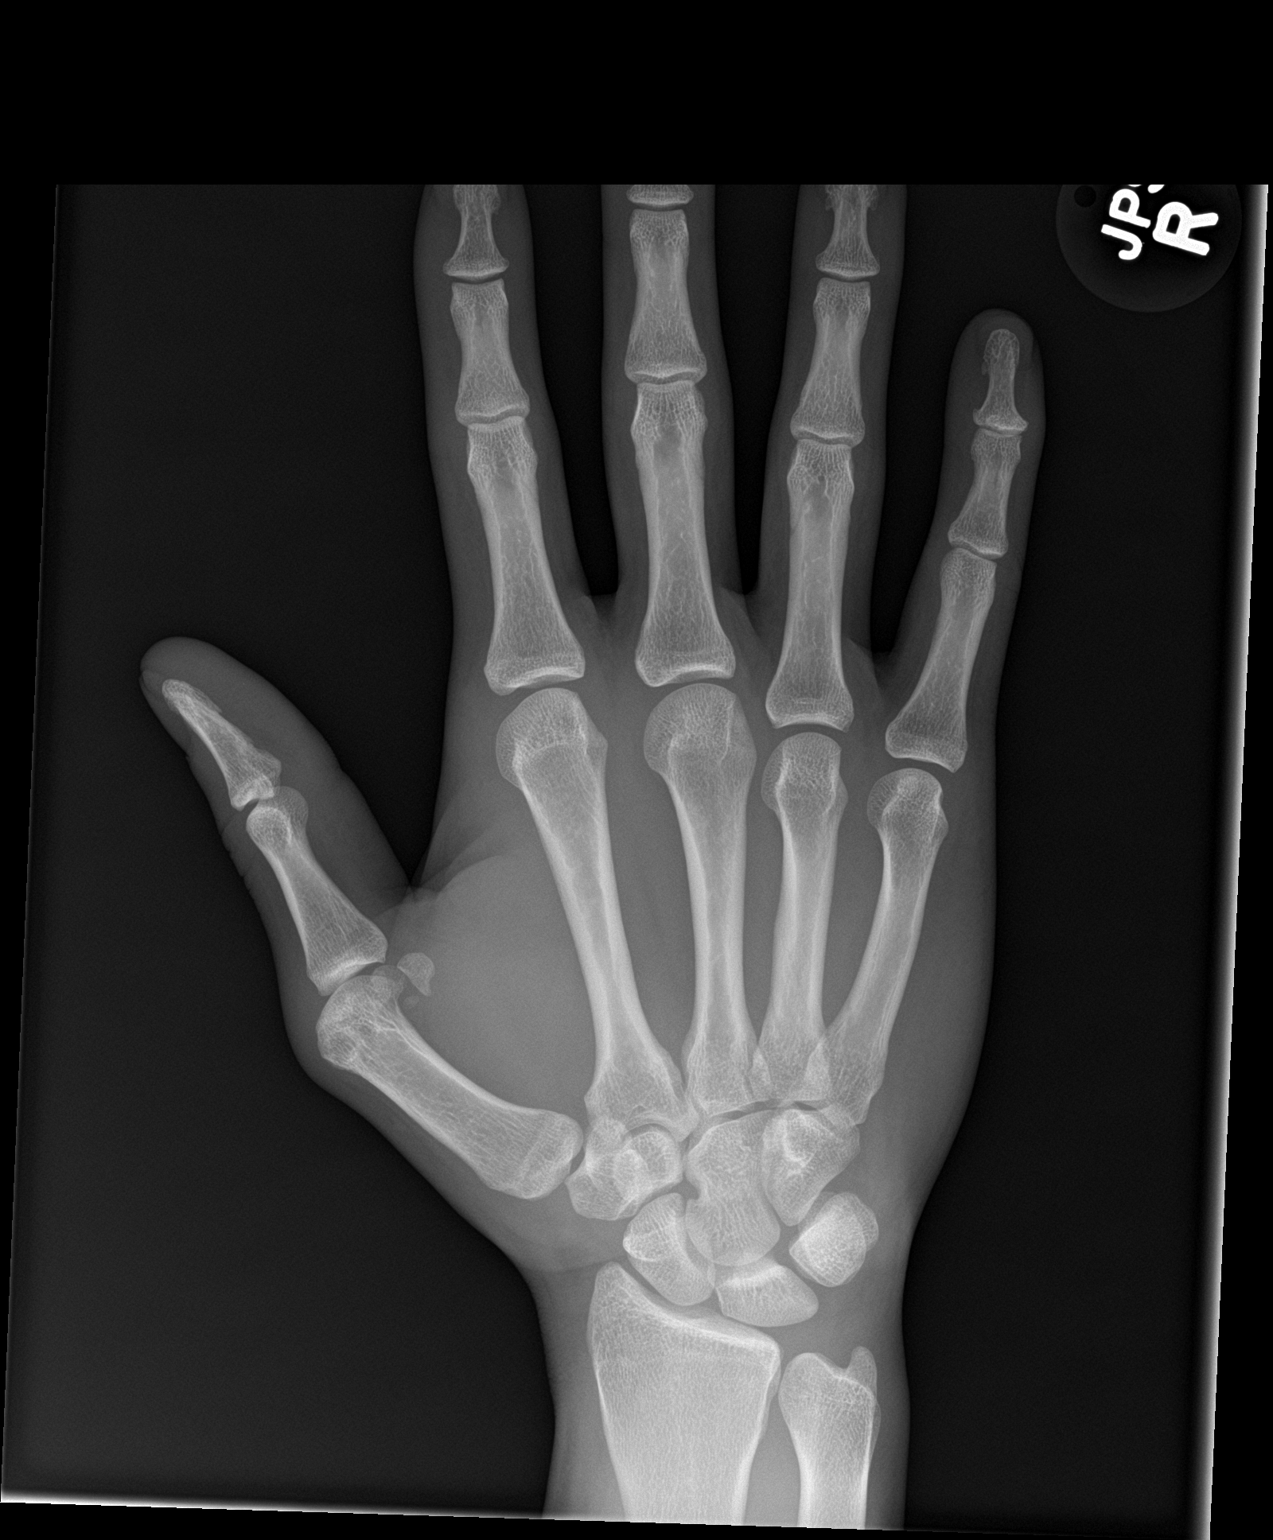

[hand obl]
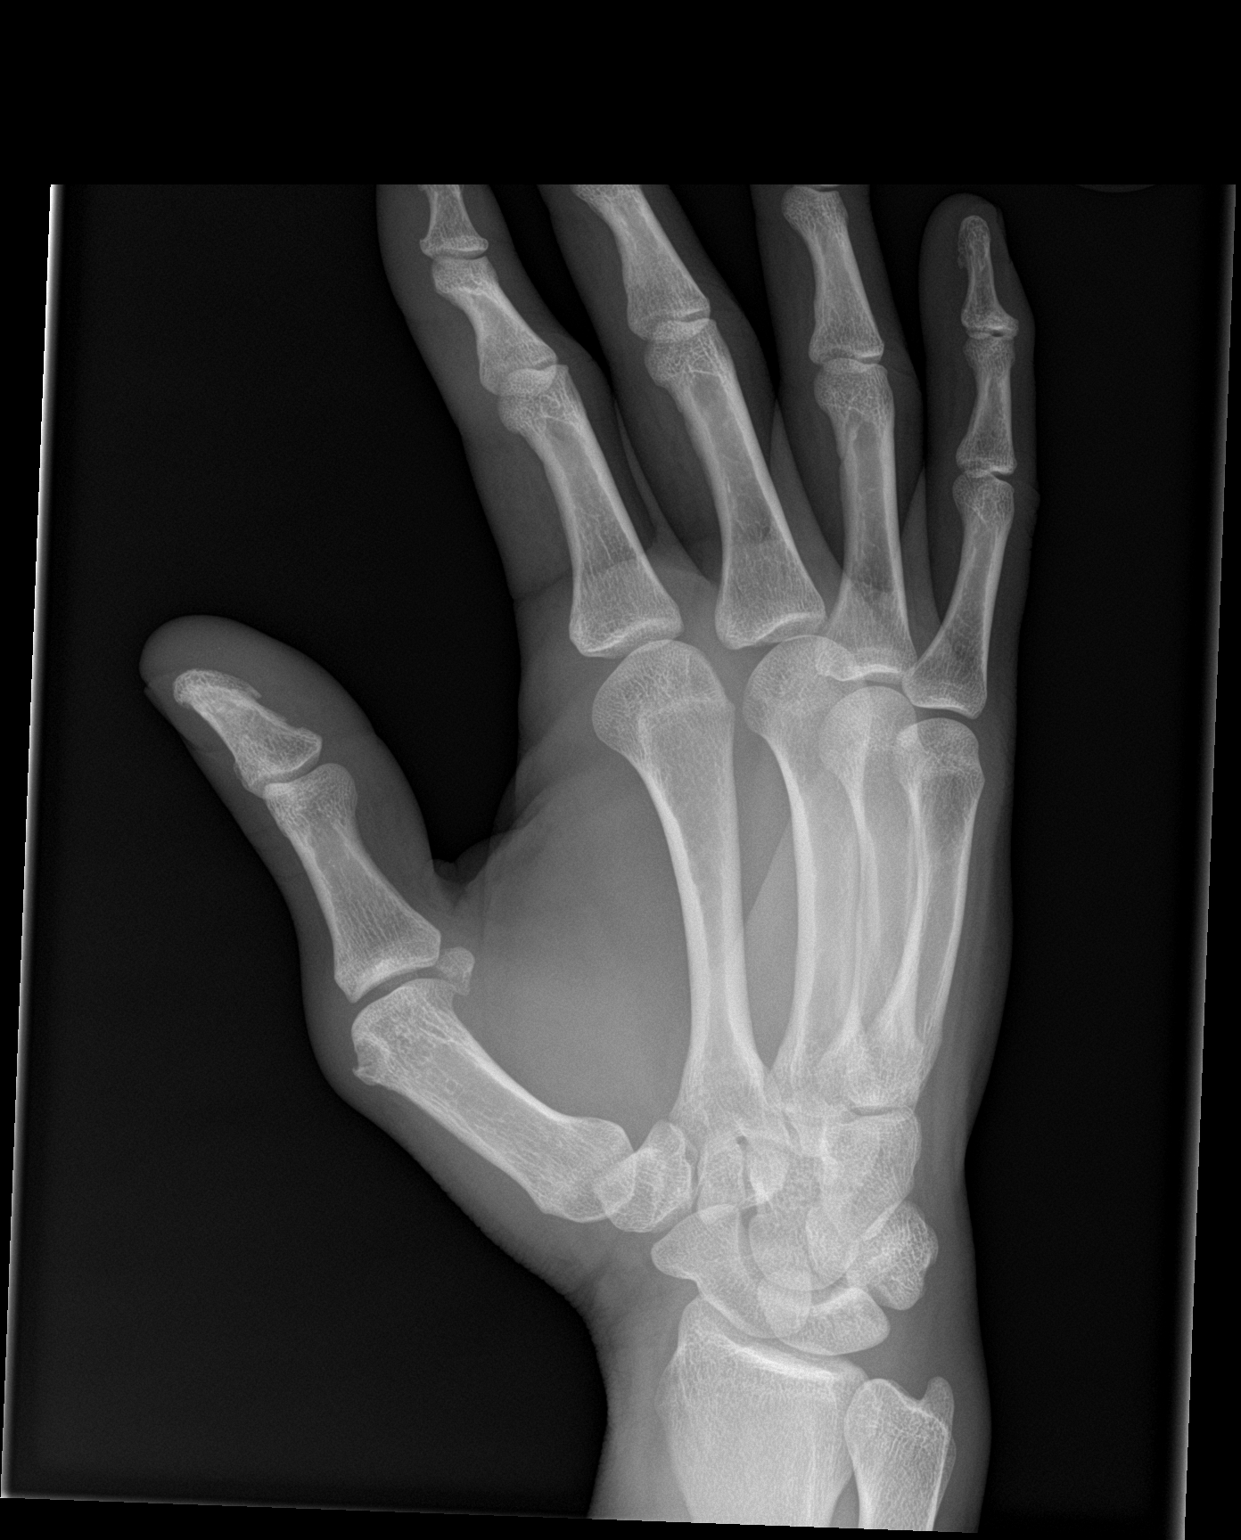

[hand lat]
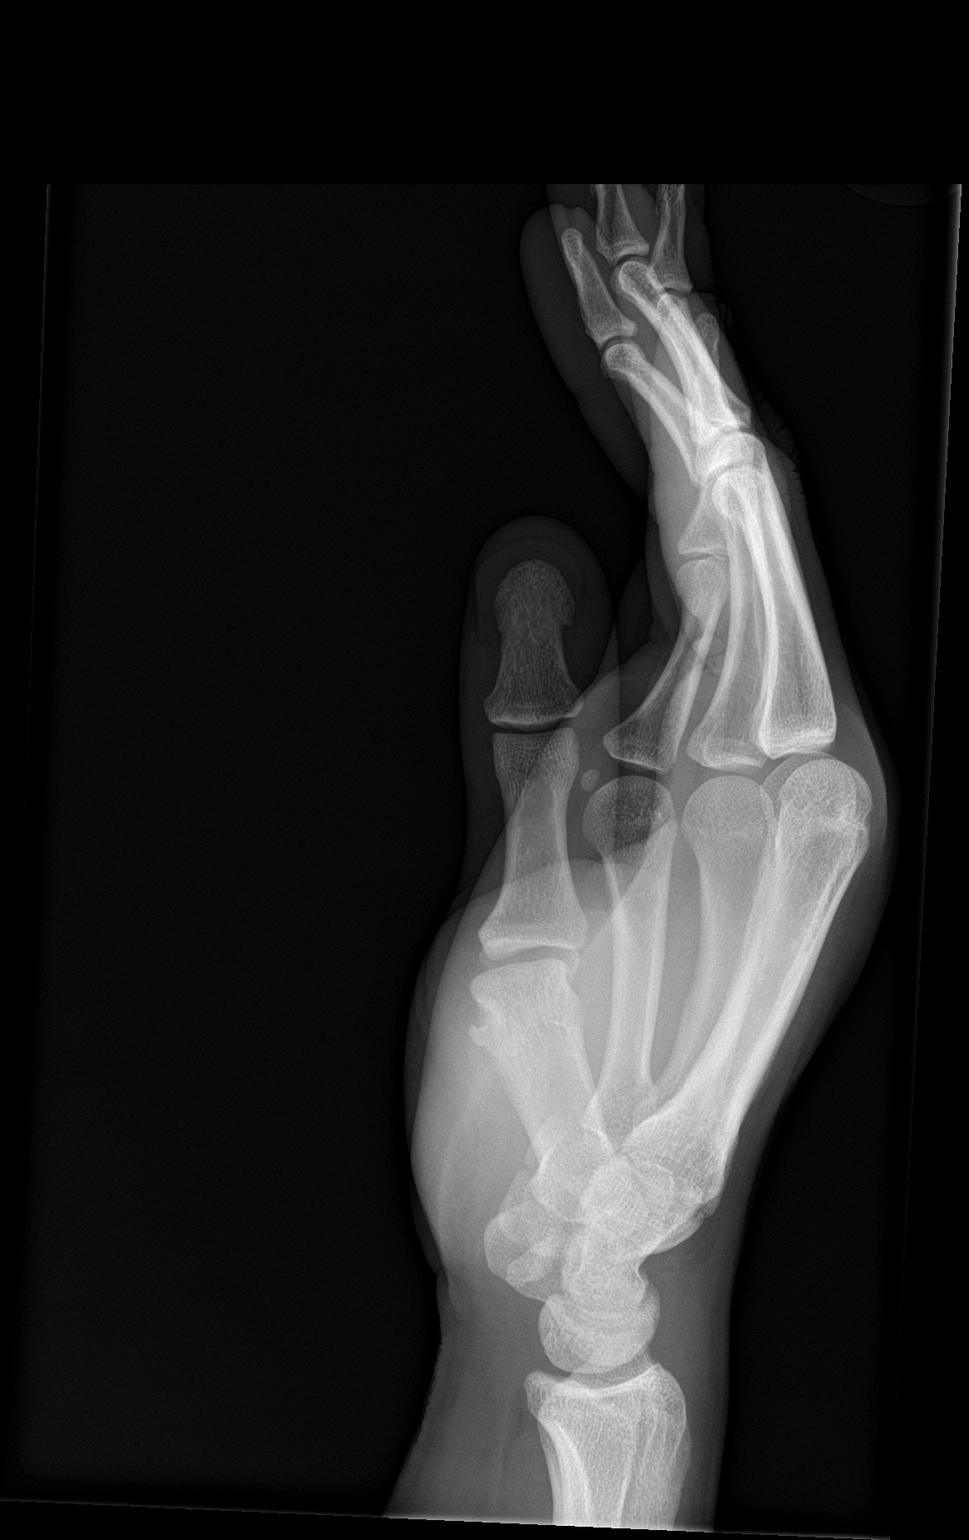

[3 of 3 positions shown; findings below may reference images not displayed]

FINDINGS: There is no evidence of fracture or dislocation. There is no
evidence of arthropathy or other focal bone abnormality. Soft
tissues are unremarkable.
IMPRESSION: Normal exam.
